# Patient Record
Sex: Male | Born: 1965 | Hispanic: No | Marital: Married | Smoking: Never smoker
Health system: Southern US, Community
[De-identification: ages and names within clinical notes are randomized; demographics above are authoritative.]

## PROBLEM LIST (undated history)

## (undated) DIAGNOSIS — E785 Hyperlipidemia, unspecified: Secondary | ICD-10-CM

## (undated) DIAGNOSIS — M1990 Unspecified osteoarthritis, unspecified site: Secondary | ICD-10-CM

## (undated) HISTORY — DX: Hyperlipidemia, unspecified: E78.5

## (undated) HISTORY — DX: Unspecified osteoarthritis, unspecified site: M19.90

---

## 1999-07-19 ENCOUNTER — Encounter: Payer: Self-pay | Admitting: Emergency Medicine

## 1999-07-19 ENCOUNTER — Emergency Department (HOSPITAL_COMMUNITY): Admission: EM | Admit: 1999-07-19 | Discharge: 1999-07-19 | Payer: Self-pay | Admitting: Emergency Medicine

## 1999-07-24 ENCOUNTER — Ambulatory Visit (HOSPITAL_BASED_OUTPATIENT_CLINIC_OR_DEPARTMENT_OTHER): Admission: RE | Admit: 1999-07-24 | Discharge: 1999-07-24 | Payer: Self-pay | Admitting: Orthopedic Surgery

## 2021-04-22 ENCOUNTER — Other Ambulatory Visit: Payer: Self-pay

## 2021-04-22 ENCOUNTER — Encounter: Payer: Self-pay | Admitting: Internal Medicine

## 2021-04-22 ENCOUNTER — Ambulatory Visit (INDEPENDENT_AMBULATORY_CARE_PROVIDER_SITE_OTHER): Payer: PRIVATE HEALTH INSURANCE | Admitting: Internal Medicine

## 2021-04-22 VITALS — BP 118/84 | HR 99 | Temp 98.2°F | Ht 61.75 in | Wt 201.0 lb

## 2021-04-22 DIAGNOSIS — Z23 Encounter for immunization: Secondary | ICD-10-CM

## 2021-04-22 DIAGNOSIS — M25561 Pain in right knee: Secondary | ICD-10-CM

## 2021-04-22 DIAGNOSIS — G4733 Obstructive sleep apnea (adult) (pediatric): Secondary | ICD-10-CM | POA: Diagnosis not present

## 2021-04-22 DIAGNOSIS — Z0000 Encounter for general adult medical examination without abnormal findings: Secondary | ICD-10-CM

## 2021-04-22 DIAGNOSIS — M25562 Pain in left knee: Secondary | ICD-10-CM

## 2021-04-22 DIAGNOSIS — E669 Obesity, unspecified: Secondary | ICD-10-CM

## 2021-04-22 DIAGNOSIS — R5383 Other fatigue: Secondary | ICD-10-CM

## 2021-04-22 DIAGNOSIS — Z1211 Encounter for screening for malignant neoplasm of colon: Secondary | ICD-10-CM | POA: Diagnosis not present

## 2021-04-22 NOTE — Patient Instructions (Signed)
-Nice seeing you today!!  -Return fasting for labs.  -tetanus and first shingles vaccines today.  -remember to get your COVID booster at your pharmacy.  -referral for colonoscopy has been made today.   Preventive Care 11-54 Years Old, Male Preventive care refers to lifestyle choices and visits with your health care provider that can promote health and wellness. This includes:  A yearly physical exam. This is also called an annual wellness visit.  Regular dental and eye exams.  Immunizations.  Screening for certain conditions.  Healthy lifestyle choices, such as: ? Eating a healthy diet. ? Getting regular exercise. ? Not using drugs or products that contain nicotine and tobacco. ? Limiting alcohol use. What can I expect for my preventive care visit? Physical exam Your health care provider will check your:  Height and weight. These may be used to calculate your BMI (body mass index). BMI is a measurement that tells if you are at a healthy weight.  Heart rate and blood pressure.  Body temperature.  Skin for abnormal spots. Counseling Your health care provider may ask you questions about your:  Past medical problems.  Family's medical history.  Alcohol, tobacco, and drug use.  Emotional well-being.  Home life and relationship well-being.  Sexual activity.  Diet, exercise, and sleep habits.  Work and work Astronomer.  Access to firearms. What immunizations do I need? Vaccines are usually given at various ages, according to a schedule. Your health care provider will recommend vaccines for you based on your age, medical history, and lifestyle or other factors, such as travel or where you work.   What tests do I need? Blood tests  Lipid and cholesterol levels. These may be checked every 5 years, or more often if you are over 85 years old.  Hepatitis C test.  Hepatitis B test. Screening  Lung cancer screening. You may have this screening every year  starting at age 35 if you have a 30-pack-year history of smoking and currently smoke or have quit within the past 15 years.  Prostate cancer screening. Recommendations will vary depending on your family history and other risks.  Genital exam to check for testicular cancer or hernias.  Colorectal cancer screening. ? All adults should have this screening starting at age 22 and continuing until age 40. ? Your health care provider may recommend screening at age 81 if you are at increased risk. ? You will have tests every 1-10 years, depending on your results and the type of screening test.  Diabetes screening. ? This is done by checking your blood sugar (glucose) after you have not eaten for a while (fasting). ? You may have this done every 1-3 years.  STD (sexually transmitted disease) testing, if you are at risk. Follow these instructions at home: Eating and drinking  Eat a diet that includes fresh fruits and vegetables, whole grains, lean protein, and low-fat dairy products.  Take vitamin and mineral supplements as recommended by your health care provider.  Do not drink alcohol if your health care provider tells you not to drink.  If you drink alcohol: ? Limit how much you have to 0-2 drinks a day. ? Be aware of how much alcohol is in your drink. In the [place] one drink equals one 12 oz bottle of beer (355 mL), one 5 oz glass of wine (148 mL), or one 1 oz glass of hard liquor (44 mL).   Lifestyle  Take daily care of your teeth and gums. Brush your teeth every  morning and night with fluoride toothpaste. Floss one time each day.  Stay active. Exercise for at least 30 minutes 5 or more days each week.  Do not use any products that contain nicotine or tobacco, such as cigarettes, e-cigarettes, and chewing tobacco. If you need help quitting, ask your health care provider.  Do not use drugs.  If you are sexually active, practice safe sex. Use a condom or other form of protection to  prevent STIs (sexually transmitted infections).  If told by your health care provider, take low-dose aspirin daily starting at age 67.  Find healthy ways to cope with stress, such as: ? Meditation, yoga, or listening to music. ? Journaling. ? Talking to a trusted person. ? Spending time with friends and family. Safety  Always wear your seat belt while driving or riding in a vehicle.  Do not drive: ? If you have been drinking alcohol. Do not ride with someone who has been drinking. ? When you are tired or distracted. ? While texting.  Wear a helmet and other protective equipment during sports activities.  If you have firearms in your house, make sure you follow all gun safety procedures. What's next?  Go to your health care provider once a year for an annual wellness visit.  Ask your health care provider how often you should have your eyes and teeth checked.  Stay up to date on all vaccines. This information is not intended to replace advice given to you by your health care provider. Make sure you discuss any questions you have with your health care provider. Document Revised: 08/15/2019 Document Reviewed: 11/10/2018 Elsevier Patient Education  2021 [employer].

## 2021-04-22 NOTE — Progress Notes (Signed)
New Patient Office Visit     This visit occurred during the SARS-CoV-2 public health emergency.  Safety protocols were in place, including screening questions prior to the visit, additional usage of staff PPE, and extensive cleaning of exam room while observing appropriate contact time as indicated for disinfecting solutions.    CC/Reason for Visit: Establish care, annual preventive exam, discuss some acute concerns Previous PCP: None Last Visit: Unknown  HPI: Anthony Nichols is a 55 y.o. male who is coming in today for the above mentioned reasons.  He has no past medical history of significance although he has not had routine medical care in over 25 years.  He works in Teacher, early years/pre.  He does not smoke, he does not drink, he has no known drug allergies.  He takes no medications, has had no past surgical history.  His family history significant for mother with diabetes as well as a brother with diabetes.  His father died from colon cancer at age 5 and his brother also passed with colon cancer in his 7s.  He has not yet had a colonoscopy.  He has had 2 COVID vaccines but no boosters, he has not had a recent Tdap or shingles vaccine.  He does not have routine eye or dental care.  He has been complaining of bilateral knee pain.  His wife has also been complaining of increased snoring, frequent nighttime awakenings.  He frequently has daytime fatigue.  He does not exercise routinely.  Past Medical/Surgical History: History reviewed. No pertinent past medical history.  History reviewed. No pertinent surgical history.  Social History:  reports that he has never smoked. He has never used smokeless tobacco. He reports current alcohol use of about 2.0 standard drinks of alcohol per week. He reports that he does not use drugs.  Allergies: No Known Allergies  Family History:  Family History  Problem Relation Age of Onset  . Diabetes Mother   . Colon cancer Father   . Colon cancer Brother      No current outpatient medications on file.  Review of Systems:  Constitutional: Denies fever, chills, diaphoresis, appetite change and fatigue.  HEENT: Denies photophobia, eye pain, redness, hearing loss, ear pain, congestion, sore throat, rhinorrhea, sneezing, mouth sores, trouble swallowing, neck pain, neck stiffness and tinnitus.   Respiratory: Denies SOB, DOE, cough, chest tightness,  and wheezing.   Cardiovascular: Denies chest pain, palpitations and leg swelling.  Gastrointestinal: Denies nausea, vomiting, abdominal pain, diarrhea, constipation, blood in stool and abdominal distention.  Genitourinary: Denies dysuria, urgency, frequency, hematuria, flank pain and difficulty urinating.  Endocrine: Denies: hot or cold intolerance, sweats, changes in hair or nails, polyuria, polydipsia. Musculoskeletal: Denies myalgias, back pain, joint swelling.  Skin: Denies pallor, rash and wound.  Neurological: Denies dizziness, seizures, syncope, weakness, light-headedness, numbness and headaches.  Hematological: Denies adenopathy. Easy bruising, personal or family bleeding history  Psychiatric/Behavioral: Denies suicidal ideation, mood changes, confusion, nervousness and agitation    Physical Exam: Vitals:   04/22/21 1559  BP: 118/84  Pulse: 99  Temp: 98.2 F (36.8 C)  TempSrc: Oral  SpO2: 96%  Weight: 201 lb (91.2 kg)  Height: 5' 1.75" (1.568 m)   Body mass index is 37.06 kg/m.   Constitutional: NAD, calm, comfortable Eyes: PERRL, lids and conjunctivae normal ENMT: Mucous membranes are moist. Posterior pharynx clear of any exudate or lesions. Normal dentition. Tympanic membrane is pearly white, no erythema or bulging. Neck: normal, supple, no masses, no thyromegaly Respiratory: clear  to auscultation bilaterally, no wheezing, no crackles. Normal respiratory effort. No accessory muscle use.  Cardiovascular: Regular rate and rhythm, no murmurs / rubs / gallops. No extremity edema.  2+ pedal pulses. No carotid bruits.  Abdomen: no tenderness, no masses palpated. No hepatosplenomegaly. Bowel sounds positive.  Musculoskeletal: no clubbing / cyanosis. No joint deformity upper and lower extremities. Good ROM, no contractures. Normal muscle tone.  Skin: no rashes, lesions, ulcers. No induration Neurologic: CN 2-12 grossly intact. Sensation intact, DTR normal. Strength 5/5 in all 4.  Psychiatric: Normal judgment and insight. Alert and oriented x 3. Normal mood.    Impression and Plan:  Encounter for preventive health examination  - Plan: CBC with Differential/Platelet, Comprehensive metabolic panel, Hemoglobin A1c, Lipid panel -Advised routine eye and dental care. -Tdap and for shingles in office today, he will get his COVID boosters at the pharmacy. -Screening labs today. -Healthy lifestyle discussed in detail. -Referral to GI for screening colonoscopy.  Acute pain of both knees -Suspect bilateral knee osteoarthritis  - Plan: Ambulatory referral to Sports Medicine  OSA (obstructive sleep apnea) -With snoring, obesity, frequent nighttime awakenings and daytime fatigue, I believe obstructive sleep apnea is likely.  - Plan: Ambulatory referral to Neurology for sleep medicine.  Colon cancer screening  - Plan: Ambulatory referral to Gastroenterology -Very significant family history with 2 first-degree family members dying at young age from colon cancer.  Need for Tdap vaccination  - Plan: Tdap vaccine greater than or equal to 7yo IM  Need for shingles vaccine  - Plan: Varicella-zoster vaccine IM (Shingrix)  Obesity (BMI 35.0-39.9 without comorbidity) -Discussed healthy lifestyle, including increased physical activity and better food choices to promote weight loss.  Fatigue, unspecified type  - Plan: TSH, Vitamin B12, VITAMIN D 25 Hydroxy (Vit-D Deficiency, Fractures)    Patient Instructions   -Nice seeing you today!!  -Return fasting for labs.  -tetanus  and first shingles vaccines today.  -remember to get your COVID booster at your pharmacy.  -referral for colonoscopy has been made today.   Preventive Care 98-49 Years Old, Male Preventive care refers to lifestyle choices and visits with your health care provider that can promote health and wellness. This includes:  A yearly physical exam. This is also called an annual wellness visit.  Regular dental and eye exams.  Immunizations.  Screening for certain conditions.  Healthy lifestyle choices, such as: ? Eating a healthy diet. ? Getting regular exercise. ? Not using drugs or products that contain nicotine and tobacco. ? Limiting alcohol use. What can I expect for my preventive care visit? Physical exam Your health care provider will check your:  Height and weight. These may be used to calculate your BMI (body mass index). BMI is a measurement that tells if you are at a healthy weight.  Heart rate and blood pressure.  Body temperature.  Skin for abnormal spots. Counseling Your health care provider may ask you questions about your:  Past medical problems.  Family's medical history.  Alcohol, tobacco, and drug use.  Emotional well-being.  Home life and relationship well-being.  Sexual activity.  Diet, exercise, and sleep habits.  Work and work Astronomer.  Access to firearms. What immunizations do I need? Vaccines are usually given at various ages, according to a schedule. Your health care provider will recommend vaccines for you based on your age, medical history, and lifestyle or other factors, such as travel or where you work.   What tests do I need? Blood tests  Lipid  and cholesterol levels. These may be checked every 5 years, or more often if you are over 70 years old.  Hepatitis C test.  Hepatitis B test. Screening  Lung cancer screening. You may have this screening every year starting at age 37 if you have a 30-pack-year history of smoking and  currently smoke or have quit within the past 15 years.  Prostate cancer screening. Recommendations will vary depending on your family history and other risks.  Genital exam to check for testicular cancer or hernias.  Colorectal cancer screening. ? All adults should have this screening starting at age 51 and continuing until age 7. ? Your health care provider may recommend screening at age 57 if you are at increased risk. ? You will have tests every 1-10 years, depending on your results and the type of screening test.  Diabetes screening. ? This is done by checking your blood sugar (glucose) after you have not eaten for a while (fasting). ? You may have this done every 1-3 years.  STD (sexually transmitted disease) testing, if you are at risk. Follow these instructions at home: Eating and drinking  Eat a diet that includes fresh fruits and vegetables, whole grains, lean protein, and low-fat dairy products.  Take vitamin and mineral supplements as recommended by your health care provider.  Do not drink alcohol if your health care provider tells you not to drink.  If you drink alcohol: ? Limit how much you have to 0-2 drinks a day. ? Be aware of how much alcohol is in your drink. In the [place] one drink equals one 12 oz bottle of beer (355 mL), one 5 oz glass of wine (148 mL), or one 1 oz glass of hard liquor (44 mL).   Lifestyle  Take daily care of your teeth and gums. Brush your teeth every morning and night with fluoride toothpaste. Floss one time each day.  Stay active. Exercise for at least 30 minutes 5 or more days each week.  Do not use any products that contain nicotine or tobacco, such as cigarettes, e-cigarettes, and chewing tobacco. If you need help quitting, ask your health care provider.  Do not use drugs.  If you are sexually active, practice safe sex. Use a condom or other form of protection to prevent STIs (sexually transmitted infections).  If told by your  health care provider, take low-dose aspirin daily starting at age 34.  Find healthy ways to cope with stress, such as: ? Meditation, yoga, or listening to music. ? Journaling. ? Talking to a trusted person. ? Spending time with friends and family. Safety  Always wear your seat belt while driving or riding in a vehicle.  Do not drive: ? If you have been drinking alcohol. Do not ride with someone who has been drinking. ? When you are tired or distracted. ? While texting.  Wear a helmet and other protective equipment during sports activities.  If you have firearms in your house, make sure you follow all gun safety procedures. What's next?  Go to your health care provider once a year for an annual wellness visit.  Ask your health care provider how often you should have your eyes and teeth checked.  Stay up to date on all vaccines. This information is not intended to replace advice given to you by your health care provider. Make sure you discuss any questions you have with your health care provider. Document Revised: 08/15/2019 Document Reviewed: 11/10/2018 Elsevier Patient Education  2021 [employer].  Lelon Frohlich, MD [facility]

## 2021-04-29 ENCOUNTER — Ambulatory Visit: Payer: Self-pay | Admitting: Nurse Practitioner

## 2021-04-30 ENCOUNTER — Other Ambulatory Visit: Payer: Self-pay | Admitting: Internal Medicine

## 2021-04-30 ENCOUNTER — Encounter: Payer: Self-pay | Admitting: Internal Medicine

## 2021-04-30 ENCOUNTER — Other Ambulatory Visit (INDEPENDENT_AMBULATORY_CARE_PROVIDER_SITE_OTHER): Payer: PRIVATE HEALTH INSURANCE

## 2021-04-30 ENCOUNTER — Telehealth: Payer: Self-pay | Admitting: Internal Medicine

## 2021-04-30 ENCOUNTER — Other Ambulatory Visit: Payer: Self-pay

## 2021-04-30 DIAGNOSIS — E781 Pure hyperglyceridemia: Secondary | ICD-10-CM

## 2021-04-30 DIAGNOSIS — E538 Deficiency of other specified B group vitamins: Secondary | ICD-10-CM

## 2021-04-30 DIAGNOSIS — E559 Vitamin D deficiency, unspecified: Secondary | ICD-10-CM

## 2021-04-30 DIAGNOSIS — L309 Dermatitis, unspecified: Secondary | ICD-10-CM

## 2021-04-30 DIAGNOSIS — R5383 Other fatigue: Secondary | ICD-10-CM

## 2021-04-30 DIAGNOSIS — Z0000 Encounter for general adult medical examination without abnormal findings: Secondary | ICD-10-CM

## 2021-04-30 LAB — CBC WITH DIFFERENTIAL/PLATELET
Basophils Absolute: 0.1 10*3/uL (ref 0.0–0.1)
Basophils Relative: 1 % (ref 0.0–3.0)
Eosinophils Absolute: 0.4 10*3/uL (ref 0.0–0.7)
Eosinophils Relative: 4.8 % (ref 0.0–5.0)
HCT: 46.3 % (ref 39.0–52.0)
Hemoglobin: 15.8 g/dL (ref 13.0–17.0)
Lymphocytes Relative: 23.1 % (ref 12.0–46.0)
Lymphs Abs: 1.7 10*3/uL (ref 0.7–4.0)
MCHC: 34.2 g/dL (ref 30.0–36.0)
MCV: 90.7 fl (ref 78.0–100.0)
Monocytes Absolute: 0.7 10*3/uL (ref 0.1–1.0)
Monocytes Relative: 8.9 % (ref 3.0–12.0)
Neutro Abs: 4.7 10*3/uL (ref 1.4–7.7)
Neutrophils Relative %: 62.2 % (ref 43.0–77.0)
Platelets: 208 10*3/uL (ref 150.0–400.0)
RBC: 5.1 Mil/uL (ref 4.22–5.81)
RDW: 14.2 % (ref 11.5–15.5)
WBC: 7.6 10*3/uL (ref 4.0–10.5)

## 2021-04-30 LAB — COMPREHENSIVE METABOLIC PANEL
ALT: 24 U/L (ref 0–53)
AST: 19 U/L (ref 0–37)
Albumin: 4.2 g/dL (ref 3.5–5.2)
Alkaline Phosphatase: 93 U/L (ref 39–117)
BUN: 19 mg/dL (ref 6–23)
CO2: 28 mEq/L (ref 19–32)
Calcium: 9.2 mg/dL (ref 8.4–10.5)
Chloride: 102 mEq/L (ref 96–112)
Creatinine, Ser: 0.98 mg/dL (ref 0.40–1.50)
GFR: 87.71 mL/min (ref >60.00)
Glucose, Bld: 95 mg/dL (ref 70–99)
Potassium: 4.4 mEq/L (ref 3.5–5.1)
Sodium: 141 mEq/L (ref 135–145)
Total Bilirubin: 1 mg/dL (ref 0.2–1.2)
Total Protein: 6.8 g/dL (ref 6.0–8.3)

## 2021-04-30 LAB — LIPID PANEL
Cholesterol: 174 mg/dL (ref 0–200)
HDL: 30.1 mg/dL — ABNORMAL LOW (ref >39.00)
NonHDL: 144.33
Total CHOL/HDL Ratio: 6
Triglycerides: 352 mg/dL — ABNORMAL HIGH (ref 0.0–149.0)
VLDL: 70.4 mg/dL — ABNORMAL HIGH (ref 0.0–40.0)

## 2021-04-30 LAB — VITAMIN D 25 HYDROXY (VIT D DEFICIENCY, FRACTURES): VITD: 13.57 ng/mL — ABNORMAL LOW (ref 30.00–100.00)

## 2021-04-30 LAB — VITAMIN B12: Vitamin B-12: 258 pg/mL (ref 211–911)

## 2021-04-30 LAB — HEMOGLOBIN A1C: Hgb A1c MFr Bld: 5.7 % (ref 4.6–6.5)

## 2021-04-30 LAB — TSH: TSH: 3.12 u[IU]/mL (ref 0.35–4.50)

## 2021-04-30 LAB — LDL CHOLESTEROL, DIRECT: Direct LDL: 87 mg/dL

## 2021-04-30 MED ORDER — VITAMIN D (ERGOCALCIFEROL) 1.25 MG (50000 UNIT) PO CAPS
50000.0000 [IU] | ORAL_CAPSULE | ORAL | 0 refills | Status: AC
Start: 2021-04-30 — End: 2021-07-17

## 2021-04-30 MED ORDER — FISH OIL 1000 MG PO CAPS: 1.0000 | ORAL_CAPSULE | Freq: Two times a day (BID) | ORAL | 0 refills | Status: AC

## 2021-04-30 MED ORDER — TRIAMCINOLONE ACETONIDE 0.1 % EX CREA: 1.0000 "application " | TOPICAL_CREAM | Freq: Two times a day (BID) | CUTANEOUS | 0 refills | Status: DC

## 2021-04-30 MED ORDER — VITAMIN B-12 1000 MCG PO TABS
1000.0000 ug | ORAL_TABLET | Freq: Every day | ORAL | Status: AC
Start: 2021-04-30 — End: ?

## 2021-04-30 NOTE — Telephone Encounter (Signed)
Pt came in for his lab appointment and inquired about a medication for a rash that he spoke with Dr, Ardyth Harps about on his visit on 04/23/2021.  Pt stated it is a cream.  Pharm:  [facility] on [address]

## 2021-05-06 ENCOUNTER — Ambulatory Visit: Payer: Self-pay

## 2021-05-06 ENCOUNTER — Ambulatory Visit (INDEPENDENT_AMBULATORY_CARE_PROVIDER_SITE_OTHER): Payer: PRIVATE HEALTH INSURANCE

## 2021-05-06 ENCOUNTER — Ambulatory Visit (INDEPENDENT_AMBULATORY_CARE_PROVIDER_SITE_OTHER): Payer: PRIVATE HEALTH INSURANCE | Admitting: Family Medicine

## 2021-05-06 ENCOUNTER — Encounter: Payer: Self-pay | Admitting: Gastroenterology

## 2021-05-06 ENCOUNTER — Other Ambulatory Visit: Payer: Self-pay

## 2021-05-06 ENCOUNTER — Encounter: Payer: Self-pay | Admitting: Family Medicine

## 2021-05-06 VITALS — BP 104/78 | HR 110 | Ht 61.75 in | Wt 208.6 lb

## 2021-05-06 DIAGNOSIS — M25561 Pain in right knee: Secondary | ICD-10-CM

## 2021-05-06 DIAGNOSIS — G8929 Other chronic pain: Secondary | ICD-10-CM

## 2021-05-06 DIAGNOSIS — M25562 Pain in left knee: Secondary | ICD-10-CM

## 2021-05-06 NOTE — Patient Instructions (Signed)
Thank you for coming in today.  Call or go to the ER if you develop a large red swollen joint with extreme pain or oozing puss.   Please get an Xray today before you leave  Please use Voltaren gel (Generic Diclofenac Gel) up to 4x daily for pain as needed.  This is available over-the-counter as both the name brand Voltaren gel and the generic diclofenac gel.  Recheck in about 6 weeks.  Return sooner if needed.

## 2021-05-06 NOTE — Progress Notes (Signed)
I, Anthony Nichols, LAT, ATC, am serving as scribe for Dr. [name].  Subjective:    I'm seeing this patient as a consultation for:  Dr. [name]. Note will be routed back to referring provider/PCP.  CC: Bilat knee pain  HPI: Pt is a 55 y/o male c/o bilat knee pain, R >L x couple years that has been worsening. Pt works in [employer]. Pt locates pain to his B ant knees and some into his medial knee as well.  Knee swelling: yes, intermittently Mechanical symptoms: yes Aggravates: prolonged standing; climbing a ladder Treatments tried: Tylenol  Past medical history, Surgical history, Family history, Social history, Allergies, and medications have been entered into the medical record, reviewed.   Review of Systems: No new headache, visual changes, nausea, vomiting, diarrhea, constipation, dizziness, abdominal pain, skin rash, fevers, chills, night sweats, weight loss, swollen lymph nodes, body aches, joint swelling, muscle aches, chest pain, shortness of breath, mood changes, visual or auditory hallucinations.   Objective:    Vitals:   05/06/21 1603  BP: 104/78  Pulse: (!) 110  SpO2: 95%   General: Well Developed, well nourished, and in no acute distress.  Neuro/Psych: Alert and oriented x3, extra-ocular muscles intact, able to move all 4 extremities, sensation grossly intact. Skin: Warm and dry, no rashes noted.  Respiratory: Not using accessory muscles, speaking in full sentences, trachea midline.  Cardiovascular: Pulses palpable, no extremity edema. Abdomen: Does not appear distended. MSK:  Right knee mild effusion normal-appearing otherwise normal motion with crepitation.   Tender palpation medial joint line. Stable ligamentous exam. Negative McMurray's test Intact strength.  Left knee slight swelling anterior knee overlying patella otherwise normal. Normal motion with crepitation. Tender palpation medial joint line. Stable ligamentous exam. Negative  McMurray's test. Intact strength.   Lab and Radiology Results  Procedure: Real-time Ultrasound Guided Injection of right knee superior lateral patellar space Device: Philips Affiniti 50G Images permanently stored and available for review in PACS Ultrasound evaluation prior to injection reveals mild effusion.  Significant medial compartment degenerative changes.  Calcifications present superior patellar space.  No Baker's cyst. Verbal informed consent obtained.  Discussed risks and benefits of procedure. Warned about infection bleeding damage to structures skin hypopigmentation and fat atrophy among others. Patient expresses understanding and agreement Time-out conducted.   Noted no overlying erythema, induration, or other signs of local infection.   Skin prepped in a sterile fashion.   Local anesthesia: Topical Ethyl chloride.   With sterile technique and under real time ultrasound guidance:  40 mg of Kenalog and 2 mL of Marcaine injected into knee joint. Fluid seen entering the joint capsule.   Completed without difficulty   Pain immediately resolved suggesting accurate placement of the medication.   Advised to call if fevers/chills, erythema, induration, drainage, or persistent bleeding.   Images permanently stored and available for review in the ultrasound unit.  Impression: Technically successful ultrasound guided injection.    Procedure: Real-time Ultrasound Guided Injection of left knee superior patellar space Device: Philips Affiniti 50G Images permanently stored and available for review in PACS Ultrasound evaluation prior to injection reveals moderate medial compartment DJD.  No Baker's cyst.  Calcifications present superior patellar space. Verbal informed consent obtained.  Discussed risks and benefits of procedure. Warned about infection bleeding damage to structures skin hypopigmentation and fat atrophy among others. Patient expresses understanding and agreement Time-out  conducted.   Noted no overlying erythema, induration, or other signs of local infection.   Skin prepped in  a sterile fashion.   Local anesthesia: Topical Ethyl chloride.   With sterile technique and under real time ultrasound guidance:  40 mg of Kenalog and 2 mL of Marcaine injected into knee joint. Fluid seen entering the joint capsule.   Completed without difficulty   Pain immediately resolved suggesting accurate placement of the medication.   Advised to call if fevers/chills, erythema, induration, drainage, or persistent bleeding.   Images permanently stored and available for review in the ultrasound unit.  Impression: Technically successful ultrasound guided injection.    X-ray images bilateral knees obtained today personally and independently interpreted  Right knee: Moderate medial compartment DJD.  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  Calcifications present superior lateral patellar space thought to represent a loose bodies.  Left knee: Mild to moderate bicompartmental DJD.  Mild to moderate patellofemoral DJD.  Calcifications present superior patellar space again thought to represent loose bodies.  Await formal radiology review    Impression and Recommendations:    Assessment and Plan: 55 y.o. male with bilateral knee pain.  Multifactorial cause of knee pain.  Predominantly main source of pain is DJD however patient does have significant loose bodies on x-ray which were also visible on ultrasound.  Plan to treat with Voltaren gel and steroid injections today.  Focus also on weight loss and quad strengthening.  Recheck in 6 weeks.Marland Kitchen  PDMP not reviewed this encounter. Orders Placed This Encounter  Procedures  . [place] LIMITED JOINT SPACE STRUCTURES LOW BILAT(NO LINKED CHARGES)    Order Specific Question:   Reason for Exam (SYMPTOM  OR DIAGNOSIS REQUIRED)    Answer:   B knee pain    Order Specific Question:   Preferred imaging location?    Answer:   [facility]  . DG Knee AP/LAT W/Sunrise Left    Standing Status:   Future    Number of Occurrences:   1    Standing Expiration Date:   06/05/2021    Order Specific Question:   Reason for Exam (SYMPTOM  OR DIAGNOSIS REQUIRED)    Answer:   L knee pain    Order Specific Question:   Preferred imaging location?    Answer:   [facility]  . DG Knee AP/LAT W/Sunrise Right    Standing Status:   Future    Number of Occurrences:   1    Standing Expiration Date:   06/05/2021    Order Specific Question:   Reason for Exam (SYMPTOM  OR DIAGNOSIS REQUIRED)    Answer:   R knee pain    Order Specific Question:   Preferred imaging location?    Answer:   [facility]   No orders of the defined types were placed in this encounter.   Discussed warning signs or symptoms. Please see discharge instructions. Patient expresses understanding.   The above documentation has been reviewed and is accurate and complete [name], M.D.

## 2021-05-07 ENCOUNTER — Encounter: Payer: Self-pay | Admitting: Family Medicine

## 2021-05-09 NOTE — Progress Notes (Signed)
X-ray right knee shows arthritis and loose bodies in the knee joint.

## 2021-05-09 NOTE — Progress Notes (Signed)
X-ray left knee shows arthritis with multiple loose bodies in the knee joint.  Plan on recheck in 6 weeks as scheduled.  If not better surgery may be an option

## 2021-05-12 NOTE — Progress Notes (Signed)
Spoke w/ pt and conveyed Dr. Corey's result note. Pt verbalized understanding.  

## 2021-06-09 ENCOUNTER — Telehealth: Payer: Self-pay | Admitting: *Deleted

## 2021-06-09 NOTE — Telephone Encounter (Signed)
No show for pre visit , unable to reach or leave a message.  Missed appointment letter sent, colonoscopy cancelled.

## 2021-06-17 ENCOUNTER — Ambulatory Visit: Payer: PRIVATE HEALTH INSURANCE | Admitting: Family Medicine

## 2021-06-23 ENCOUNTER — Encounter: Payer: PRIVATE HEALTH INSURANCE | Admitting: Gastroenterology

## 2021-06-27 NOTE — Progress Notes (Signed)
I, Anthony Nichols, LAT, ATC acting as a scribe for [name], MD.  Anthony Nichols is a 55 y.o. 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 Pt was last seen by Dr. Denyse Amass on 05/06/21 and was given bilat knee steroid injections and was advised to focus on weight loss, quad strengthening, and Voltaren gel. Today, pt reports that B knees continue. Had 2 weeks of relief from injection. L>R   Dx imaging: 05/06/21 R & L knee XR  Pertinent review of systems: No fevers or chills  Relevant historical information: Hypertriglyceridemia   Exam:  BP 118/82   Pulse (!) 113   Ht 5' 1.75" (1.568 m)   Wt 208 lb (94.3 kg)   SpO2 96%   BMI 38.35 kg/m  General: Well Developed, well nourished, and in no acute distress.   MSK: Knees bilateral moderate effusion normal motion with crepitation    Lab and Radiology Results [place] LIMITED JOINT SPACE STRUCTURES LOW BILAT(NO LINKED CHARGES)  Result Date: 05/08/2021 Formatting of this result is different from the original. Procedure: Real-time Ultrasound Guided Injection of right knee superior lateral patellar space Device: Philips Affiniti 50G Images permanently stored and available for review in PACS Ultrasound evaluation prior to injection reveals mild effusion.  Significant medial compartment degenerative changes.  Calcifications present superior patellar space.  No Baker's cyst. Verbal informed consent obtained.  Discussed risks and benefits of procedure. Warned about infection bleeding damage to structures skin hypopigmentation and fat atrophy among others. Patient expresses understanding and agreement Time-out conducted.   Noted no overlying erythema, induration, or other signs of local infection.   Skin prepped in a sterile fashion.   Local anesthesia: Topical Ethyl chloride.   With sterile technique and under real time ultrasound guidance:  40 mg of  Kenalog and 2 mL of Marcaine injected into knee joint. Fluid seen entering the joint capsule.   Completed without difficulty   Pain immediately resolved suggesting accurate placement of the medication.   Advised to call if fevers/chills, erythema, induration, drainage, or persistent bleeding.   Images permanently stored and available for review in the ultrasound unit. Impression: Technically successful ultrasound guided injection.       Procedure: Real-time Ultrasound Guided Injection of left knee superior patellar space Device: Philips Affiniti 50G Images permanently stored and available for review in PACS Ultrasound evaluation prior to injection reveals moderate medial compartment DJD.  No Baker's cyst.  Calcifications present superior patellar space. Verbal informed consent obtained.  Discussed risks and benefits of procedure. Warned about infection bleeding damage to structures skin hypopigmentation and fat atrophy among others. Patient expresses understanding and agreement Time-out conducted.   Noted no overlying erythema, induration, or other signs of local infection.   Skin prepped in a sterile fashion.   Local anesthesia: Topical Ethyl chloride.   With sterile technique and under real time ultrasound guidance:  40 mg of Kenalog and 2 mL of Marcaine injected into knee joint. Fluid seen entering the joint capsule.   Completed without difficulty   Pain immediately resolved suggesting accurate placement of the medication.   Advised to call if fevers/chills, erythema, induration, drainage, or persistent bleeding.   Images permanently stored and available for review in the ultrasound unit. Impression: Technically successful ultrasound guided injection.    DG Knee AP/LAT W/Sunrise Left  Result Date: 05/08/2021 CLINICAL DATA:  Anterior knee pain EXAM: LEFT  KNEE 3 VIEWS COMPARISON:  None. FINDINGS: No fracture or malalignment. Tricompartment arthritis with severe disease involving the patellofemoral joint space.  Multiple calcified suprapatellar loose bodies. 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   On: 05/08/2021 18:14   DG Knee AP/LAT W/Sunrise Right  Result Date: 05/08/2021 CLINICAL DATA:  Right-sided knee pain EXAM: RIGHT KNEE 3 VIEWS COMPARISON:  None. FINDINGS: No fracture or malalignment. Tricompartment arthritis with moderate severe degenerative changes involving the medial and patellofemoral joint spaces. Multiple calcified loose bodies in the suprapatellar region. Joint space calcification consistent with chondrocalcinosis IMPRESSION: 1. Tricompartment arthritis with probable calcified loose bodies 2. Chondrocalcinosis Electronically Signed   By: Jasmine Pang M.D.   On: 05/08/2021 18:15    I, [name], personally (independently) visualized and performed the interpretation of the xray images attached in this note.     Assessment and Plan: 55 y.o. male with bilateral knee pain due to DJD and multiple calcified loose bodies.  Patient has pretty significant patellofemoral DJD bilaterally which probably is the majority source of pain.  The source of his multiple loose bodies is unclear at this time.  Steroid injection did not provide any sort of lasting relief.  Plan to work on authorization for hyaluronic acid injections and proceed with gel shots.  If this does not help more gel shots or not available would recommend orthopedic surgery consultation for potential total knee replacement evaluation.    Discussed warning signs or symptoms. Please see discharge instructions. Patient expresses understanding.   The above documentation has been reviewed and is accurate and complete [name], M.D.

## 2021-06-30 ENCOUNTER — Other Ambulatory Visit: Payer: Self-pay

## 2021-06-30 ENCOUNTER — Encounter: Payer: Self-pay | Admitting: Family Medicine

## 2021-06-30 ENCOUNTER — Ambulatory Visit (INDEPENDENT_AMBULATORY_CARE_PROVIDER_SITE_OTHER): Payer: PRIVATE HEALTH INSURANCE | Admitting: Family Medicine

## 2021-06-30 VITALS — BP 118/82 | HR 113 | Ht 61.75 in | Wt 208.0 lb

## 2021-06-30 DIAGNOSIS — G8929 Other chronic pain: Secondary | ICD-10-CM | POA: Diagnosis not present

## 2021-06-30 DIAGNOSIS — M25561 Pain in right knee: Secondary | ICD-10-CM | POA: Diagnosis not present

## 2021-06-30 DIAGNOSIS — M25562 Pain in left knee: Secondary | ICD-10-CM | POA: Diagnosis not present

## 2021-06-30 NOTE — Patient Instructions (Signed)
Thank you for coming in today.   I will work on the gel shots. Let me know if you do not hear anything in 2 weeks.   If the gel shots dont work or we cant get them then it may make sense to talk to a surgeon about knee replacement.

## 2021-07-21 ENCOUNTER — Encounter: Payer: Self-pay | Admitting: Family Medicine

## 2021-10-06 ENCOUNTER — Ambulatory Visit: Payer: PRIVATE HEALTH INSURANCE | Admitting: Family Medicine

## 2021-10-10 ENCOUNTER — Ambulatory Visit (INDEPENDENT_AMBULATORY_CARE_PROVIDER_SITE_OTHER): Payer: PRIVATE HEALTH INSURANCE | Admitting: Family Medicine

## 2021-10-10 ENCOUNTER — Encounter: Payer: Self-pay | Admitting: Family Medicine

## 2021-10-10 ENCOUNTER — Other Ambulatory Visit: Payer: Self-pay

## 2021-10-10 ENCOUNTER — Ambulatory Visit: Payer: Self-pay

## 2021-10-10 DIAGNOSIS — M170 Bilateral primary osteoarthritis of knee: Secondary | ICD-10-CM | POA: Diagnosis not present

## 2021-10-10 DIAGNOSIS — G8929 Other chronic pain: Secondary | ICD-10-CM

## 2021-10-10 DIAGNOSIS — M25561 Pain in right knee: Secondary | ICD-10-CM

## 2021-10-10 DIAGNOSIS — M25562 Pain in left knee: Secondary | ICD-10-CM

## 2021-10-10 NOTE — Progress Notes (Signed)
Anthony Nichols presents to clinic today for bilateral Orthovisc injections 1/3  Procedure: Real-time Ultrasound Guided Injection of right knee superior lateral patellar space Device: Philips Affiniti 50G Images permanently stored and available for review in PACS Verbal informed consent obtained.  Discussed risks and benefits of procedure. Warned about infection bleeding damage to structures skin hypopigmentation and fat atrophy among others. Patient expresses understanding and agreement Time-out conducted.   Noted no overlying erythema, induration, or other signs of local infection.   Skin prepped in a sterile fashion.   Local anesthesia: Topical Ethyl chloride.   With sterile technique and under real time ultrasound guidance: Orthovisc 30 mg injected into knee joint. Fluid seen entering the joint capsule.   Completed without difficulty   Advised to call if fevers/chills, erythema, induration, drainage, or persistent bleeding.   Images permanently stored and available for review in the ultrasound unit.  Impression: Technically successful ultrasound guided injection.    Procedure: Real-time Ultrasound Guided Injection of left knee superior lateral patellar space Device: Philips Affiniti 50G Images permanently stored and available for review in PACS Verbal informed consent obtained.  Discussed risks and benefits of procedure. Warned about infection bleeding damage to structures skin hypopigmentation and fat atrophy among others. Patient expresses understanding and agreement Time-out conducted.   Noted no overlying erythema, induration, or other signs of local infection.   Skin prepped in a sterile fashion.   Local anesthesia: Topical Ethyl chloride.   With sterile technique and under real time ultrasound guidance: Orthovisc 30 mg injected into knee joint. Fluid seen entering the joint capsule.   Completed without difficulty   Advised to call if fevers/chills, erythema, induration, drainage, or  persistent bleeding.   Images permanently stored and available for review in the ultrasound unit.  Impression: Technically successful ultrasound guided injection.  Lot number: [id number] for both injections  Return in 1 week Orthovisc injection bilateral knees 2/3

## 2021-10-10 NOTE — Patient Instructions (Signed)
Good to see you today.  You had B knee Orthovisc injections.  Call or go to the ER if you develop a large red swollen joint with extreme pain or oozing puss.   Please schedule 2 follow-up visits over the next 2 weeks (1 each week for the next 2 weeks).

## 2021-10-17 ENCOUNTER — Ambulatory Visit: Payer: PRIVATE HEALTH INSURANCE | Admitting: Family Medicine

## 2021-10-27 ENCOUNTER — Other Ambulatory Visit: Payer: Self-pay

## 2021-10-27 ENCOUNTER — Ambulatory Visit (INDEPENDENT_AMBULATORY_CARE_PROVIDER_SITE_OTHER): Payer: PRIVATE HEALTH INSURANCE | Admitting: Family Medicine

## 2021-10-27 ENCOUNTER — Ambulatory Visit: Payer: Self-pay

## 2021-10-27 DIAGNOSIS — M25562 Pain in left knee: Secondary | ICD-10-CM

## 2021-10-27 DIAGNOSIS — G8929 Other chronic pain: Secondary | ICD-10-CM

## 2021-10-27 DIAGNOSIS — M25561 Pain in right knee: Secondary | ICD-10-CM | POA: Diagnosis not present

## 2021-10-27 DIAGNOSIS — M170 Bilateral primary osteoarthritis of knee: Secondary | ICD-10-CM | POA: Diagnosis not present

## 2021-10-27 NOTE — Progress Notes (Signed)
Anthony Nichols presents to clinic today for Orthovisc injections bilateral knees 2/3  Procedure: Real-time Ultrasound Guided Injection of right knee superior lateral patellar space Device: Philips Affiniti 50G Images permanently stored and available for review in PACS Verbal informed consent obtained.  Discussed risks and benefits of procedure. Warned about infection bleeding damage to structures skin hypopigmentation and fat atrophy among others. Patient expresses understanding and agreement Time-out conducted.   Noted no overlying erythema, induration, or other signs of local infection.   Skin prepped in a sterile fashion.   Local anesthesia: Topical Ethyl chloride.   With sterile technique and under real time ultrasound guidance: Orthovisc 30 mg injected into knee joint. Fluid seen entering the joint capsule.   Completed without difficulty    Advised to call if fevers/chills, erythema, induration, drainage, or persistent bleeding.   Images permanently stored and available for review in the ultrasound unit.  Impression: Technically successful ultrasound guided injection.    Procedure: Real-time Ultrasound Guided Injection of left knee superior lateral patellar space Device: Philips Affiniti 50G Images permanently stored and available for review in PACS Verbal informed consent obtained.  Discussed risks and benefits of procedure. Warned about infection bleeding damage to structures skin hypopigmentation and fat atrophy among others. Patient expresses understanding and agreement Time-out conducted.   Noted no overlying erythema, induration, or other signs of local infection.   Skin prepped in a sterile fashion.   Local anesthesia: Topical Ethyl chloride.   With sterile technique and under real time ultrasound guidance: Orthovisc 30 mg injected into knee joint. Fluid seen entering the joint capsule.   Completed without difficulty   Advised to call if fevers/chills, erythema, induration, drainage,  or persistent bleeding.   Images permanently stored and available for review in the ultrasound unit.  Impression: Technically successful ultrasound guided injection.  Lot number: [id number]  Return in 1 week for Orthovisc injection bilateral knees 3/3

## 2021-10-27 NOTE — Patient Instructions (Addendum)
Thank you for coming in today.   You received gel injections in both of your knees today. Seek immediate medical attention if the joint becomes red, extremely painful, or is oozing fluid.   Schedule a visit with me for next week for the 3rd Orthovisc injection.

## 2021-11-03 ENCOUNTER — Ambulatory Visit: Payer: PRIVATE HEALTH INSURANCE | Admitting: Family Medicine

## 2022-02-09 ENCOUNTER — Ambulatory Visit (INDEPENDENT_AMBULATORY_CARE_PROVIDER_SITE_OTHER): Payer: Managed Care, Other (non HMO) | Admitting: Family Medicine

## 2022-02-09 ENCOUNTER — Other Ambulatory Visit: Payer: Self-pay

## 2022-02-09 VITALS — BP 156/102 | HR 99 | Ht 61.75 in | Wt 206.0 lb

## 2022-02-09 DIAGNOSIS — R202 Paresthesia of skin: Secondary | ICD-10-CM | POA: Diagnosis not present

## 2022-02-09 DIAGNOSIS — M25562 Pain in left knee: Secondary | ICD-10-CM

## 2022-02-09 DIAGNOSIS — M25561 Pain in right knee: Secondary | ICD-10-CM | POA: Diagnosis not present

## 2022-02-09 DIAGNOSIS — G8929 Other chronic pain: Secondary | ICD-10-CM | POA: Diagnosis not present

## 2022-02-09 NOTE — Progress Notes (Signed)
? ?  I, Philbert Riser, LAT, ATC acting as a scribe for [name], MD. ? ?Anthony Nichols is a 56 y.o. male who presents to [facility] at [facility] today for bilat knee pain. Pt was last seen by Dr. Denyse Amass on 10/27/21 for chronic bilat knee pain and was given his 2nd Orthovisc injection bilaterally. Pt never returned to complete the Orthovisc series, no-show 11/03/21. Today, pt reports he wanted to get the 3rd Orthovisc injection today. [place] and [place] visit.  Since he was seen in November and completed his second Orthovisc injection his insurance has changed and the authorization for Orthovisc is lapsed. ? ?Additionally he notes that his knee is feeling a fair amount better. ? ?He does note today that he is having bothersome paresthesias to his left foot noting that he has numbness tingling and a feeling that his foot is asleep when he walks.  He complains that this is the entire aspect of his plantar foot.  This has been ongoing for months and is very bothersome. ? ?Dx imaging: 05/06/21 R & L knee XR ? ?Pertinent review of systems: No fevers or chills ? ?Relevant historical information: B12 deficiency and hypertriglyceridemia history. ? ? ?Exam:  ?BP (!) 156/102   Pulse 99   Ht 5' 1.75" (1.568 m)   Wt 206 lb (93.4 kg)   SpO2 97%   BMI 37.98 kg/m?  ?General: Well Developed, well nourished, and in no acute distress.  ? ?MSK: Bilateral knees normal.  Normal motion with crepitation. ? ?Left foot normal-appearing ?Normal motion.  Sensation is intact to light touch ?Pulses are intact. ? ? ? ? ? ? ?Assessment and Plan: ?56 y.o. male with bilateral knee pain due to DJD.  He never did complete his Orthovisc series back in November.  We are unable to complete the Orthovisc series today as his insurance is changed and the authorization has lapsed.  However I do not think that we need to is he is feeling a lot better.  He will provide [place] with his new insurance  information so that we can work on the new authorization if necessary. ? ?However his new issue today is bothersome paresthesias left foot.  The distribution is not consistent with any isolated dermatomal pattern and I am concerned that he may have peripheral neuropathy.  Plan for nerve conduction study to help clarify his paresthesia. ? ?Recheck after nerve conduction study. ? ? ?PDMP not reviewed this encounter. ?Orders Placed This Encounter  ?Procedures  ? Ambulatory referral to Neurology  ?  Referral Priority:   Routine  ?  Referral Type:   Consultation  ?  Referral Reason:   Specialty Services Required  ?  Requested Specialty:   Neurology  ?  Number of Visits Requested:   1  ? NCV with EMG(electromyography)  ?  Standing Status:   Future  ?  Standing Expiration Date:   02/10/2023  ?  Order Specific Question:   Where should this test be performed?  ?  Answer:   [facility]  ? ?No orders of the defined types were placed in this encounter. ? ? ? ?Discussed warning signs or symptoms. Please see discharge instructions. Patient expresses understanding. ? ? ?The above documentation has been reviewed and is accurate and complete [name], M.D. ? ? ?

## 2022-02-09 NOTE — Patient Instructions (Addendum)
Thank you for coming in today.  ? ?Please send [place] a MyChart message with your new insurance card ? ?I've ordered a nerve conduction study. ? ?Recheck back after test ?

## 2022-02-23 ENCOUNTER — Telehealth: Payer: Self-pay | Admitting: Internal Medicine

## 2022-02-23 ENCOUNTER — Ambulatory Visit: Payer: Managed Care, Other (non HMO) | Admitting: Internal Medicine

## 2022-02-23 NOTE — Telephone Encounter (Signed)
Ok for pt to transfer? ?

## 2022-02-23 NOTE — Telephone Encounter (Signed)
Fine with me. BJ 

## 2022-02-23 NOTE — Telephone Encounter (Signed)
Patient is requesting to transfer to Dr. [place]. ?

## 2022-02-24 NOTE — Telephone Encounter (Signed)
Left message to return phone call.  Please schedule pt for transfer of care.  ?

## 2022-03-23 ENCOUNTER — Ambulatory Visit (INDEPENDENT_AMBULATORY_CARE_PROVIDER_SITE_OTHER): Payer: Managed Care, Other (non HMO) | Admitting: Internal Medicine

## 2022-03-23 VITALS — BP 114/70 | HR 88 | Temp 98.5°F | Ht 63.0 in | Wt 205.5 lb

## 2022-03-23 DIAGNOSIS — Z23 Encounter for immunization: Secondary | ICD-10-CM

## 2022-03-23 DIAGNOSIS — E538 Deficiency of other specified B group vitamins: Secondary | ICD-10-CM | POA: Diagnosis not present

## 2022-03-23 DIAGNOSIS — Z0000 Encounter for general adult medical examination without abnormal findings: Secondary | ICD-10-CM | POA: Diagnosis not present

## 2022-03-23 DIAGNOSIS — E781 Pure hyperglyceridemia: Secondary | ICD-10-CM

## 2022-03-23 DIAGNOSIS — H539 Unspecified visual disturbance: Secondary | ICD-10-CM

## 2022-03-23 DIAGNOSIS — E559 Vitamin D deficiency, unspecified: Secondary | ICD-10-CM

## 2022-03-23 DIAGNOSIS — Z125 Encounter for screening for malignant neoplasm of prostate: Secondary | ICD-10-CM | POA: Diagnosis not present

## 2022-03-23 DIAGNOSIS — Z1211 Encounter for screening for malignant neoplasm of colon: Secondary | ICD-10-CM

## 2022-03-23 DIAGNOSIS — Z114 Encounter for screening for human immunodeficiency virus [HIV]: Secondary | ICD-10-CM

## 2022-03-23 DIAGNOSIS — Z1159 Encounter for screening for other viral diseases: Secondary | ICD-10-CM

## 2022-03-23 DIAGNOSIS — J302 Other seasonal allergic rhinitis: Secondary | ICD-10-CM

## 2022-03-23 LAB — CBC WITH DIFFERENTIAL/PLATELET
Basophils Absolute: 0.1 10*3/uL (ref 0.0–0.1)
Basophils Relative: 1 % (ref 0.0–3.0)
Eosinophils Absolute: 0.4 10*3/uL (ref 0.0–0.7)
Eosinophils Relative: 6.6 % — ABNORMAL HIGH (ref 0.0–5.0)
HCT: 47.8 % (ref 39.0–52.0)
Hemoglobin: 15.9 g/dL (ref 13.0–17.0)
Lymphocytes Relative: 41.1 % (ref 12.0–46.0)
Lymphs Abs: 2.3 10*3/uL (ref 0.7–4.0)
MCHC: 33.3 g/dL (ref 30.0–36.0)
MCV: 92.8 fl (ref 78.0–100.0)
Monocytes Absolute: 0.3 10*3/uL (ref 0.1–1.0)
Monocytes Relative: 5.9 % (ref 3.0–12.0)
Neutro Abs: 2.6 10*3/uL (ref 1.4–7.7)
Neutrophils Relative %: 45.4 % (ref 43.0–77.0)
Platelets: 193 10*3/uL (ref 150.0–400.0)
RBC: 5.15 Mil/uL (ref 4.22–5.81)
RDW: 13.9 % (ref 11.5–15.5)
WBC: 5.7 10*3/uL (ref 4.0–10.5)

## 2022-03-23 LAB — COMPREHENSIVE METABOLIC PANEL
ALT: 28 U/L (ref 0–53)
AST: 22 U/L (ref 0–37)
Albumin: 4 g/dL (ref 3.5–5.2)
Alkaline Phosphatase: 96 U/L (ref 39–117)
BUN: 16 mg/dL (ref 6–23)
CO2: 25 mEq/L (ref 19–32)
Calcium: 8.6 mg/dL (ref 8.4–10.5)
Chloride: 106 mEq/L (ref 96–112)
Creatinine, Ser: 0.82 mg/dL (ref 0.40–1.50)
GFR: 99.29 mL/min (ref >60.00)
Glucose, Bld: 117 mg/dL — ABNORMAL HIGH (ref 70–99)
Potassium: 3.9 mEq/L (ref 3.5–5.1)
Sodium: 141 mEq/L (ref 135–145)
Total Bilirubin: 0.5 mg/dL (ref 0.2–1.2)
Total Protein: 6.7 g/dL (ref 6.0–8.3)

## 2022-03-23 LAB — LIPID PANEL
Cholesterol: 188 mg/dL (ref 0–200)
HDL: 33 mg/dL — ABNORMAL LOW (ref >39.00)
NonHDL: 155.3
Total CHOL/HDL Ratio: 6
Triglycerides: 322 mg/dL — ABNORMAL HIGH (ref 0.0–149.0)
VLDL: 64.4 mg/dL — ABNORMAL HIGH (ref 0.0–40.0)

## 2022-03-23 LAB — LDL CHOLESTEROL, DIRECT: Direct LDL: 101 mg/dL

## 2022-03-23 LAB — HEMOGLOBIN A1C: Hgb A1c MFr Bld: 5.8 % (ref 4.6–6.5)

## 2022-03-23 LAB — VITAMIN B12: Vitamin B-12: 283 pg/mL (ref 211–911)

## 2022-03-23 LAB — VITAMIN D 25 HYDROXY (VIT D DEFICIENCY, FRACTURES): VITD: 18.35 ng/mL — ABNORMAL LOW (ref 30.00–100.00)

## 2022-03-23 LAB — PSA: PSA: 0.49 ng/mL (ref 0.10–4.00)

## 2022-03-23 NOTE — Progress Notes (Signed)
? ? ? ?Established Patient Office Visit ? ? ? ? ?This visit occurred during the SARS-CoV-2 public health emergency.  Safety protocols were in place, including screening questions prior to the visit, additional usage of staff PPE, and extensive cleaning of exam room while observing appropriate contact time as indicated for disinfecting solutions.  ? ? ?CC/Reason for Visit: Annual preventive exam ? ?HPI: Anthony Nichols is a 56 y.o. male who is coming in today for the above mentioned reasons. Past Medical History is significant for: Vitamin D and B12 deficiencies.  He is overdue for eye and dental care.  He is overdue for his second shingles vaccine, COVID and influenza vaccinations.  He has not yet had his initial screening colonoscopy.  He has family history that is very significant for colon cancer including his father who had colon cancer at age 107 and his brother who had some form of intestinal cancer in his 19s.  He is feeling well and has no acute concerns. ? ? ?Past Medical/Surgical History: ?No past medical history on file. ? ?No past surgical history on file. ? ?Social History: ? reports that he has never smoked. He has never used smokeless tobacco. He reports current alcohol use of about 2.0 standard drinks per week. He reports that he does not use drugs. ? ?Allergies: ?No Known Allergies ? ?Family History:  ?Family History  ?Problem Relation Age of Onset  ? Diabetes Mother   ? Colon cancer Father   ? Colon cancer Brother   ? ? ? ?Current Outpatient Medications:  ?  Omega-3 Fatty Acids (FISH OIL) 1000 MG CAPS, Take 1 capsule (1,000 mg total) by mouth in the morning and at bedtime., Disp: , Rfl: 0 ?  triamcinolone cream (KENALOG) 0.1 %, Apply 1 application topically 2 (two) times daily., Disp: 30 g, Rfl: 0 ?  vitamin B-12 (CYANOCOBALAMIN) 1000 MCG tablet, Take 1 tablet (1,000 mcg total) by mouth daily., Disp: , Rfl:  ? ?Review of Systems:  ?Constitutional: Denies fever, chills, diaphoresis, appetite  change and fatigue.  ?HEENT: Denies photophobia, eye pain, redness, hearing loss, ear pain, congestion, sore throat, rhinorrhea, sneezing, mouth sores, trouble swallowing, neck pain, neck stiffness and tinnitus.   ?Respiratory: Denies SOB, DOE, cough, chest tightness,  and wheezing.   ?Cardiovascular: Denies chest pain, palpitations and leg swelling.  ?Gastrointestinal: Denies nausea, vomiting, abdominal pain, diarrhea, constipation, blood in stool and abdominal distention.  ?Genitourinary: Denies dysuria, urgency, frequency, hematuria, flank pain and difficulty urinating.  ?Endocrine: Denies: hot or cold intolerance, sweats, changes in hair or nails, polyuria, polydipsia. ?Musculoskeletal: Denies myalgias, back pain, joint swelling, arthralgias and gait problem.  ?Skin: Denies pallor, rash and wound.  ?Neurological: Denies dizziness, seizures, syncope, weakness, light-headedness, numbness and headaches.  ?Hematological: Denies adenopathy. Easy bruising, personal or family bleeding history  ?Psychiatric/Behavioral: Denies suicidal ideation, mood changes, confusion, nervousness, sleep disturbance and agitation ? ? ? ?Physical Exam: ?Vitals:  ? 03/23/22 0903  ?BP: 114/70  ?Pulse: 88  ?Temp: 98.5 ?F (36.9 ?C)  ?TempSrc: Oral  ?SpO2: 94%  ?Weight: 205 lb 8 oz (93.2 kg)  ?Height: 5\' 3"  (1.6 m)  ? ? ?Body mass index is 36.4 kg/m?. ? ? ?Constitutional: NAD, calm, comfortable ?Eyes: PERRL, lids and conjunctivae normal ?ENMT: Mucous membranes are moist. Posterior pharynx clear of any exudate or lesions. Normal dentition. Tympanic membrane is pearly white, no erythema or bulging. ?Neck: normal, supple, no masses, no thyromegaly ?Respiratory: clear to auscultation bilaterally, no wheezing, no crackles. Normal respiratory  effort. No accessory muscle use.  ?Cardiovascular: Regular rate and rhythm, no murmurs / rubs / gallops. No extremity edema. 2+ pedal pulses. No carotid bruits.  ?Abdomen: no tenderness, no masses palpated. No  hepatosplenomegaly. Bowel sounds positive.  ?Musculoskeletal: no clubbing / cyanosis. No joint deformity upper and lower extremities. Good ROM, no contractures. Normal muscle tone.  ?Skin: no rashes, lesions, ulcers. No induration ?Neurologic: CN 2-12 grossly intact. Sensation intact, DTR normal. Strength 5/5 in all 4.  ?Psychiatric: Normal judgment and insight. Alert and oriented x 3. Normal mood.  ? ? ?Impression and Plan: ? ?Encounter for preventive health examination  ?-Recommend routine eye and dental care. ?-Immunizations: Second shingles today, advised flu and COVID at pharmacy but he declines ?-Healthy lifestyle discussed in detail. ?-Labs to be updated today. ?-Colon cancer screening: Overdue, high risk due to family history, recent GI referral today. ?-Breast cancer screening: Not applicable ?-Cervical cancer screening: Not applicable ?-Lung cancer screening: Not applicable ?-Prostate cancer screening: PSA today ?-DEXA: Not applicable ? ?Vitamin D deficiency  ?- Plan: VITAMIN D 25 Hydroxy (Vit-D Deficiency, Fractures) ? ?Vitamin B12 deficiency  ?- Plan: Vitamin B12 ? ?Hypertriglyceridemia  ?- Plan: CBC with Differential/Platelet, Comprehensive metabolic panel, Lipid panel, Hemoglobin A1c ? ?Need for shingles vaccine ?-Second shingles today ? ?Encounter for screening for HIV ?-HIV antibody ? ?Encounter for hepatitis C screening test for low risk patient ?-Hep C antibody ? ?Colon cancer screening ?-This is a high risk patient due to family history, he did not follow through with GI referral previously, will resend today. ? ?Seasonal allergies ?-Allergy referral per request, have advised daily antihistamine. ? ? ? ? ?Patient Instructions  ?-Nice seeing you today!! ? ?-Lab work today; will notify you once results are available. ? ?-Second shingles vaccine today. ? ?-Remember your COVID vaccines. ? ?-eye, allergy and GI referrals placed today. ? ?-Schedule follow up in 1 year or sooner as  needed. ? ? ? ? ? ?Lelon Frohlich, MD ?[facility] ? ? ?

## 2022-03-23 NOTE — Addendum Note (Signed)
Addended byEncarnacion Slates on: 03/23/2022 09:36 AM ? ? Modules accepted: Orders ? ?

## 2022-03-23 NOTE — Patient Instructions (Signed)
-  Nice seeing you today!! ? ?-Lab work today; will notify you once results are available. ? ?-Second shingles vaccine today. ? ?-Remember your COVID vaccines. ? ?-eye, allergy and GI referrals placed today. ? ?-Schedule follow up in 1 year or sooner as needed. ? ? ?

## 2022-03-24 ENCOUNTER — Other Ambulatory Visit: Payer: Self-pay | Admitting: Internal Medicine

## 2022-03-24 DIAGNOSIS — E559 Vitamin D deficiency, unspecified: Secondary | ICD-10-CM

## 2022-03-24 LAB — HIV ANTIBODY (ROUTINE TESTING W REFLEX): HIV 1&2 Ab, 4th Generation: NONREACTIVE

## 2022-03-24 LAB — HEPATITIS C ANTIBODY
Hepatitis C Ab: NONREACTIVE
SIGNAL TO CUT-OFF: 0.1 (ref <1.00)

## 2022-03-24 MED ORDER — VITAMIN D (ERGOCALCIFEROL) 1.25 MG (50000 UNIT) PO CAPS: 50000.0000 [IU] | ORAL_CAPSULE | ORAL | 0 refills | Status: AC

## 2022-03-31 ENCOUNTER — Encounter: Payer: Self-pay | Admitting: Internal Medicine

## 2022-03-31 ENCOUNTER — Other Ambulatory Visit: Payer: Self-pay | Admitting: *Deleted

## 2022-03-31 DIAGNOSIS — E781 Pure hyperglyceridemia: Secondary | ICD-10-CM

## 2022-03-31 DIAGNOSIS — E559 Vitamin D deficiency, unspecified: Secondary | ICD-10-CM

## 2022-05-11 ENCOUNTER — Telehealth: Payer: Self-pay | Admitting: Internal Medicine

## 2022-05-11 NOTE — Telephone Encounter (Signed)
Patient is requesting to transfer to Dr. [place].    (Phone info for front staff--[place]- [phone])

## 2022-05-11 NOTE — Telephone Encounter (Signed)
This was okayed back in March.

## 2022-06-01 NOTE — Progress Notes (Unsigned)
ACUTE VISIT No chief complaint on file.  HPI: [name] is a 56 y.o. male, who is here today complaining of *** HPI  Review of Systems Rest see pertinent positives and negatives per HPI.  Current Outpatient Medications on File Prior to Visit  Medication Sig Dispense Refill  . Omega-3 Fatty Acids (FISH OIL) 1000 MG CAPS Take 1 capsule (1,000 mg total) by mouth in the morning and at bedtime.  0  . triamcinolone cream (KENALOG) 0.1 % Apply 1 application topically 2 (two) times daily. 30 g 0  . vitamin B-12 (CYANOCOBALAMIN) 1000 MCG tablet Take 1 tablet (1,000 mcg total) by mouth daily.    . Vitamin D, Ergocalciferol, (DRISDOL) 1.25 MG (50000 UNIT) CAPS capsule Take 1 capsule (50,000 Units total) by mouth every 7 (seven) days for 12 doses. 12 capsule 0   No current facility-administered medications on file prior to visit.     No past medical history on file. No Known Allergies  Social History   Socioeconomic History  . Marital status: Married    Spouse name: Not on file  . Number of children: Not on file  . Years of education: Not on file  . Highest education level: Never attended school  Occupational History  . Not on file  Tobacco Use  . Smoking status: Never  . Smokeless tobacco: Never  Substance and Sexual Activity  . Alcohol use: Yes    Alcohol/week: 2.0 standard drinks of alcohol    Types: 2 Glasses of wine per week  . Drug use: Never  . Sexual activity: Not on file  Other Topics Concern  . Not on file  Social History Narrative  . Not on file   Social Determinants of Health   Financial Resource Strain: Unknown (03/23/2022)   Overall Financial Resource Strain (CARDIA)   . Difficulty of Paying Living Expenses: Patient refused  Food Insecurity: No Food Insecurity (03/23/2022)   Hunger Vital Sign   . Worried About Programme researcher, broadcasting/film/video in the Last Year: Never true   . Ran Out of Food in the Last Year: Never true  Transportation Needs: No  Transportation Needs (03/23/2022)   PRAPARE - Transportation   . Lack of Transportation (Medical): No   . Lack of Transportation (Non-Medical): No  Physical Activity: Unknown (03/23/2022)   Exercise Vital Sign   . Days of Exercise per Week: 0 days   . Minutes of Exercise per Session: Not on file  Stress: Unknown (03/23/2022)   [employer] of Occupational Health - Occupational Stress Questionnaire   . Feeling of Stress : Patient refused  Social Connections: Moderately Isolated (03/23/2022)   Social Connection and Isolation Panel [NHANES]   . Frequency of Communication with Friends and Family: Once a week   . Frequency of Social Gatherings with Friends and Family: Once a week   . Attends Religious Services: 1 to 4 times per year   . Active Member of Clubs or Organizations: No   . Attends Banker Meetings: Not on file   . Marital Status: Married    There were no vitals filed for this visit. There is no height or weight on file to calculate BMI.  Physical Exam  ASSESSMENT AND PLAN:  There are no diagnoses linked to this encounter.   No follow-ups on file.   Donnelle Rubey G. [place], MD  [facility]. [place] [facility].  Discharge Instructions   None

## 2022-06-03 ENCOUNTER — Ambulatory Visit (INDEPENDENT_AMBULATORY_CARE_PROVIDER_SITE_OTHER): Payer: Commercial Managed Care - HMO

## 2022-06-03 ENCOUNTER — Encounter: Payer: Self-pay | Admitting: Family Medicine

## 2022-06-03 ENCOUNTER — Ambulatory Visit (INDEPENDENT_AMBULATORY_CARE_PROVIDER_SITE_OTHER): Payer: Commercial Managed Care - HMO | Admitting: Family Medicine

## 2022-06-03 VITALS — BP 118/70 | HR 91 | Resp 16 | Ht 63.0 in | Wt 207.2 lb

## 2022-06-03 DIAGNOSIS — R053 Chronic cough: Secondary | ICD-10-CM

## 2022-06-03 DIAGNOSIS — G4730 Sleep apnea, unspecified: Secondary | ICD-10-CM | POA: Diagnosis not present

## 2022-06-03 MED ORDER — OMEPRAZOLE 40 MG PO CPDR: 40.0000 mg | DELAYED_RELEASE_CAPSULE | Freq: Every day | ORAL | 0 refills | Status: DC

## 2022-06-03 NOTE — Patient Instructions (Addendum)
A few things to remember from today's visit:  Chronic cough - Plan: DG Chest 2 View  Sleep apnea, unspecified type - Plan: Ambulatory referral to Sleep Studies  Do not use My Chart to request refills or for acute issues that need immediate attention.  If a new problem present, please set up appointment sooner than planned today.  Tos crnica a veces puede ser causada por reflujo del estmago, entonces vamos a empezar Omeprazol antes del desayuno. Si pierde peso [employer] a ayudar. Un estudio de sueno fue ordenado hoy. Por favor pida una cita para ver su doctor en 4-5 semanas. Tos en los adultos Cough, Adult La tos ayuda a despejar la garganta y los pulmones. La tos puede ser un signo de una enfermedad u otra afeccin mdica. Una tos aguda puede durar [place] 2 o 3 semanas, mientras que una tos crnica puede durar 8 semanas o ms [employer]. Hay muchas cosas que pueden causar tos. Estas incluyen lo siguiente: Grmenes (virus o bacterias) que atacan las vas respiratorias. Inhalacin de cosas que alteran (irritan) los pulmones. Alergias. Asma. Mucosidad que se desliza por la parte posterior de la garganta (goteo posnasal). Fumar. cido que vuelve desde el estmago hacia el tubo que transporta los alimentos desde la boca hasta el estmago (reflujo gastroesofgico). Algunos medicamentos. Problemas pulmonares. Otras enfermedades, como insuficiencia cardaca o un cogulo de sangre en el pulmn (embolia pulmonar). Siga estas instrucciones en su casa: Medicamentos Tome los medicamentos de venta libre y los recetados solamente como se lo haya indicado el mdico. Hable con el mdico antes de tomar medicamentos que detienen la tos (antitusivos). Estilo de vida  No fume y trate de no estar cerca de humo. No consuma ningn producto que contenga nicotina o tabaco, como cigarrillos, cigarrillos electrnicos y tabaco de Theatre manager. Si necesita ayuda para dejar de fumar, consulte al mdico. Beba  suficiente lquido para mantener el pis (la [place]) de color amarillo plido. Evite la cafena. No beba alcohol si el mdico se lo prohbe. Instrucciones generales  Fjese si hay algn cambio en la tos. Informe a su mdico sobre ellos. Siempre cbrase la boca al toser. Aljese de las cosas que lo hagan toser, como perfumes, velas, humo de fogatas o productos de limpieza. Si el aire es [employer], use un humidificador o un vaporizador de niebla fra en su hogar. Si la tos empeora por la noche, pruebe con usar almohadas adicionales para elevar la cabeza mientras duerme. Descanse todo lo que sea necesario. Concurra a todas las visitas de [address] se lo haya indicado el mdico. Esto es importante. Comunquese con un mdico si: Aparecen nuevos sntomas. Tose y escupe pus. La tos no mejora despus de 2 o 3 semanas o empeora. Los medicamentos para la tos no [employer] tos y usted no duerme bien. Siente un dolor que empeora o un dolor que no se alivia con medicamentos. Tiene fiebre. Est adelgazando y no sabe por qu. Transpira durante la noche. Solicite ayuda inmediatamente si: Tose y [employer]. Tiene dificultad para respirar. [employer] late [place] rpido. Estos sntomas pueden Customer service manager. No espere a ver si los sntomas desaparecen. Solicite atencin mdica de inmediato. Comunquese con el servicio de emergencias de su localidad (911 en los [place]). No conduzca por sus propios medios [employer]. Resumen La tos ayuda a despejar la garganta y los pulmones. Hay muchas cosas que pueden causar tos. Tome los medicamentos de venta libre y los recetados solamente  como se lo haya indicado el mdico. Siempre cbrase la boca al toser. Comunquese con un mdico si tiene sntomas nuevos o tiene una tos que no mejora o que [place]. Esta informacin no tiene Theme park manager el consejo del mdico. Asegrese de hacerle al mdico cualquier pregunta que  tenga. Document Revised: 01/11/2019 Document Reviewed: 01/11/2019 Elsevier Patient Education  2023 [employer].

## 2022-06-05 NOTE — Progress Notes (Unsigned)
HPI: Anthony Nichols is a 56 y.o. male, who is here today with his wife and daughter to establish care.  Former PCP: Dr. Jerilee Hoh Last preventive routine visit: 03/23/22 I saw him for acute visit on 06/03/22 for cough. Started on PPI, he is reporting improvement of cough but his wife does not think so. No new associated symptoms. Because loud snoring and witnessed episodes of apnea, sleep study order was placed.  Chronic medical problems: HLD,B12 deficiency, and vit D deficiency.  He is not taking B12 supplementation. Lab Results  Component Value Date   [id number] 03/23/2022   HLD: He is not on pharmacologic treatment. Lab Results  Component Value Date   CHOL 188 03/23/2022   HDL 33.00 (L) 03/23/2022   LDLDIRECT 101.0 03/23/2022   TRIG 322.0 (H) 03/23/2022   CHOLHDL 6 03/23/2022   Right ankle pain. He was evaluated in acute care on 06/01/22. Right ankle X ray negative for acute fracture or dislocation. Soft tissue swelling.  Dx'ed with achilles tendinitis. Naproxen for 10 days was recommended. Pain has improved some.  He had an foot injury a week before pain started.  Review of Systems  Constitutional:  Negative for activity change, appetite change and fever.  HENT:  Negative for mouth sores, nosebleeds and sore throat.   Respiratory:  Negative for shortness of breath and wheezing.   Cardiovascular:  Negative for chest pain, palpitations and leg swelling.  Gastrointestinal:  Negative for abdominal pain, nausea and vomiting.  Genitourinary:  Negative for decreased urine volume, dysuria and hematuria.  Skin:  Negative for rash.  Neurological:  Negative for syncope, weakness, numbness and headaches.  Rest see pertinent positives and negatives per HPI.  Current Outpatient Medications on File Prior to Visit  Medication Sig Dispense Refill   Omega-3 Fatty Acids (FISH OIL) 1000 MG CAPS Take 1 capsule (1,000 mg total) by mouth in the morning and at bedtime.  0    omeprazole (PRILOSEC) 40 MG capsule Take 1 capsule (40 mg total) by mouth daily before breakfast. 60 capsule 0   triamcinolone cream (KENALOG) 0.1 % Apply 1 application topically 2 (two) times daily. 30 g 0   vitamin B-12 (CYANOCOBALAMIN) 1000 MCG tablet Take 1 tablet (1,000 mcg total) by mouth daily.     Vitamin D, Ergocalciferol, (DRISDOL) 1.25 MG (50000 UNIT) CAPS capsule Take 1 capsule (50,000 Units total) by mouth every 7 (seven) days for 12 doses. 12 capsule 0   No current facility-administered medications on file prior to visit.   Past Medical History:  Diagnosis Date   Hyperlipidemia    No Known Allergies  Family History  Problem Relation Age of Onset   Diabetes Mother    Colon cancer Father    Colon cancer Brother    Social History   Socioeconomic History   Marital status: Married    Spouse name: Not on file   Number of children: Not on file   Years of education: Not on file   Highest education level: Never attended school  Occupational History   Not on file  Tobacco Use   Smoking status: Never   Smokeless tobacco: Never  Substance and Sexual Activity   Alcohol use: Yes    Alcohol/week: 2.0 standard drinks of alcohol    Types: 2 Glasses of wine per week   Drug use: Never   Sexual activity: Not on file  Other Topics Concern   Not on file  Social History Narrative   Not on  file   Social Determinants of Health   Financial Resource Strain: Unknown (03/23/2022)   Overall Financial Resource Strain (CARDIA)    Difficulty of Paying Living Expenses: Patient refused  Food Insecurity: No Food Insecurity (03/23/2022)   Hunger Vital Sign    Worried About Running Out of Food in the Last Year: Never true    Ran Out of Food in the Last Year: Never true  Transportation Needs: No Transportation Needs (03/23/2022)   PRAPARE - Administrator, Civil Service (Medical): No    Lack of Transportation (Non-Medical): No  Physical Activity: Unknown (03/23/2022)    Exercise Vital Sign    Days of Exercise per Week: 0 days    Minutes of Exercise per Session: Not on file  Stress: Unknown (03/23/2022)   [employer] of Occupational Health - Occupational Stress Questionnaire    Feeling of Stress : Patient refused  Social Connections: Moderately Isolated (03/23/2022)   Social Connection and Isolation Panel [NHANES]    Frequency of Communication with Friends and Family: Once a week    Frequency of Social Gatherings with Friends and Family: Once a week    Attends Religious Services: 1 to 4 times per year    Active Member of [employer] or Organizations: No    Attends Banker Meetings: Not on file    Marital Status: Married   Vitals:   06/08/22 1506  BP: 136/80  Pulse: 95  Resp: 12  SpO2: 98%   Wt Readings from Last 3 Encounters:  06/08/22 208 lb 8 oz (94.6 kg)  06/03/22 207 lb 4 oz (94 kg)  03/23/22 205 lb 8 oz (93.2 kg)   Body mass index is 36.93 kg/m.  Physical Exam Vitals and nursing note reviewed.  Constitutional:      General: He is not in acute distress.    Appearance: He is well-developed.  HENT:     Head: Normocephalic and atraumatic.  Eyes:     Conjunctiva/sclera: Conjunctivae normal.  Cardiovascular:     Rate and Rhythm: Normal rate and regular rhythm.     Pulses:          Dorsalis pedis pulses are 2+ on the right side and 2+ on the left side.     Heart sounds: No murmur heard.    Comments: Varicose veins LE, bilateral. Pulmonary:     Effort: Pulmonary effort is normal. No respiratory distress.     Breath sounds: Normal breath sounds.  Abdominal:     Palpations: Abdomen is soft. There is no hepatomegaly or mass.     Tenderness: There is no abdominal tenderness.  Musculoskeletal:     Right ankle: Swelling (Mild.) and deformity (High arch) present. Decreased range of motion (Mild). Normal pulse.     Right Achilles Tendon: Tenderness present. No defects. Thompson's test negative.  Lymphadenopathy:     Cervical: No  cervical adenopathy.  Skin:    General: Skin is warm.     Findings: No erythema or rash.  Neurological:     Mental Status: He is alert and oriented to person, place, and time.     Cranial Nerves: No cranial nerve deficit.     Gait: Gait normal.  Psychiatric:     Comments: Well groomed, good eye contact.   ASSESSMENT AND PLAN:  Mr.Reubin was seen today for establish care.  Diagnoses and all orders for this visit:  Right ankle pain, unspecified chronicity Some side effects of NSAID's discussed. ABC ankle exercises 1-2 times  per week recommended. Recommend establish with podiatrist, may need costume inserts.  Chronic cough He is reporting some improvement. CXR negative for acute pulmonary disease. Continue Omeprazole 40 mg daily.  Vitamin B12 deficiency He is not on B12 supplementation. Start B12 2000 mcg once per week.  Return in about 2 months (around 08/09/2022) for cough,wt,HLD.  Mikailah Morel G. [place], MD  [facility]. [place] office.

## 2022-06-08 ENCOUNTER — Ambulatory Visit (INDEPENDENT_AMBULATORY_CARE_PROVIDER_SITE_OTHER): Payer: Commercial Managed Care - HMO | Admitting: Family Medicine

## 2022-06-08 ENCOUNTER — Encounter: Payer: Self-pay | Admitting: Family Medicine

## 2022-06-08 VITALS — BP 136/80 | HR 95 | Resp 12 | Ht 63.0 in | Wt 208.5 lb

## 2022-06-08 DIAGNOSIS — R053 Chronic cough: Secondary | ICD-10-CM | POA: Diagnosis not present

## 2022-06-08 DIAGNOSIS — M25571 Pain in right ankle and joints of right foot: Secondary | ICD-10-CM

## 2022-06-08 DIAGNOSIS — E538 Deficiency of other specified B group vitamins: Secondary | ICD-10-CM

## 2022-06-08 NOTE — Patient Instructions (Addendum)
A few things to remember from today's visit:  Right foot pain  Chronic cough  Vitamin B12 deficiency  If you need refills please call your pharmacy. Do not use My Chart to request refills or for acute issues that need immediate attention.   Pendiente examen del sueo. Tome B12 2000 mcg una vez por semana.  Please be sure medication list is accurate. If a new problem present, please set up appointment sooner than planned today.

## 2022-10-28 NOTE — Progress Notes (Signed)
ACUTE VISIT Chief Complaint  Patient presents with   Hearing Loss   HPI: [name] is a 56 y.o. male, who is here today with his wife complaining of sudden onset of hearing loss 2 weeks ago. His wife states that she has noted some hearing loss for 2 years but he denies having problem before. Problem is constant.  He has not noted nasal congestion,rhinorrhea,earache,or ear drainage.  He denies any recent respiratory infections, travel to high altitudes, or airplane travel. He occasionally uses cotton swabs but reports no wax in the affected ear.  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 He reports no tinnitus or other unusual sounds in the ear.  In addition to the hearing loss, the patient expresses interest in a vasectomy and inquires about weight loss medication. He has not been consistent with following a healthful diet and does not exercise regularly.  Review of Systems  Constitutional:  Negative for chills and fever.  Respiratory:  Negative for cough, shortness of breath and wheezing.   Cardiovascular:  Negative for chest pain and palpitations.  Gastrointestinal:  Negative for abdominal pain, nausea and vomiting.  Endocrine: Negative for cold intolerance and heat intolerance.  Neurological:  Negative for syncope and weakness.  See other pertinent positives and negatives in HPI.  Current Outpatient Medications on File Prior to Visit  Medication Sig Dispense Refill   Omega-3 Fatty Acids (FISH OIL) 1000 MG CAPS Take 1 capsule (1,000 mg total) by mouth in the morning and at bedtime.  0   triamcinolone cream (KENALOG) 0.1 % Apply 1 application topically 2 (two) times daily. 30 g 0   vitamin B-12 (CYANOCOBALAMIN) 1000 MCG tablet Take 1 tablet (1,000 mcg total) by mouth daily.     omeprazole (PRILOSEC) 40 MG capsule Take 1 capsule (40 mg total) by mouth daily before breakfast. 60  capsule 0   No current facility-administered medications on file prior to visit.   Past Medical History:  Diagnosis Date   Hyperlipidemia    No Known Allergies  Social History   Socioeconomic History   Marital status: Married    Spouse name: Not on file   Number of children: Not on file   Years of education: Not on file   Highest education level: Never attended school  Occupational History   Not on file  Tobacco Use   Smoking status: Never   Smokeless tobacco: Never  Substance and Sexual Activity   Alcohol use: Yes    Alcohol/week: 2.0 standard drinks of alcohol    Types: 2 Glasses of wine per week   Drug use: Never   Sexual activity: Not on file  Other Topics Concern   Not on file  Social History Narrative   Not on file   Social Determinants of Health   Financial Resource Strain: Unknown (03/23/2022)   Overall Financial Resource Strain (CARDIA)    Difficulty of Paying Living Expenses: Patient refused  Food Insecurity: No Food Insecurity (03/23/2022)   Hunger Vital Sign    Worried About Running Out of Food in the Last Year: Never true    Ran Out of Food in the Last Year: Never true  Transportation Needs: No Transportation Needs (03/23/2022)   PRAPARE - Administrator, Civil Service (Medical): No    Lack of Transportation (Non-Medical): No  Physical Activity: Unknown (03/23/2022)   Exercise Vital Sign  Days of Exercise per Week: 0 days    Minutes of Exercise per Session: Not on file  Stress: Unknown (03/23/2022)   [employer] of Occupational Health - Occupational Stress Questionnaire    Feeling of Stress : Patient refused  Social Connections: Moderately Isolated (03/23/2022)   Social Connection and Isolation Panel [NHANES]    Frequency of Communication with Friends and Family: Once a week    Frequency of Social Gatherings with Friends and Family: Once a week    Attends Religious Services: 1 to 4 times per year    Active Member of [employer] or  Organizations: No    Attends Banker Meetings: Not on file    Marital Status: Married    Vitals:   10/30/22 1518  BP: 120/70  Pulse: 85  Resp: 16  SpO2: 97%   Body mass index is 37.07 kg/m.  Physical Exam Vitals and nursing note reviewed.  Constitutional:      General: He is not in acute distress.    Appearance: He is well-developed. He is not ill-appearing.  HENT:     Head: Normocephalic and atraumatic.     Right Ear: Tympanic membrane, ear canal and external ear normal. Decreased hearing noted. No mastoid tenderness.     Left Ear: Tympanic membrane, ear canal and external ear normal. No mastoid tenderness.     Nose: Septal deviation and congestion present. No rhinorrhea.  Eyes:     Conjunctiva/sclera: Conjunctivae normal.  Cardiovascular:     Rate and Rhythm: Normal rate and regular rhythm.     Heart sounds: No murmur heard. Pulmonary:     Effort: Pulmonary effort is normal. No respiratory distress.     Breath sounds: Normal breath sounds. No stridor.  Lymphadenopathy:     Cervical: No cervical adenopathy.  Skin:    General: Skin is warm.     Findings: No erythema or rash.  Neurological:     Mental Status: He is alert and oriented to person, place, and time.  Psychiatric:        Mood and Affect: Mood and affect normal.   ASSESSMENT AND PLAN:  [name] is a 56 yo male that was seen today for hearing loss. Orders Placed This Encounter  Procedures   Ambulatory referral to Urology   Amb Ref to Medical Weight Management   Ambulatory referral to ENT   Sudden right hearing loss He is reporting sudden onset. Examination today without evident abnormality. Prednisone recommended,side effects discussed. Urgent ENT referral placed.  Hearing Screening   500Hz  1000Hz  2000Hz  4000Hz   Right ear Fail Fail Fail Fail  Left ear Fail Fail Pass Pass   -     predniSONE; 3 tabs daily for 10 days then 2 tabs for 8 days then 1 tab for 5 days, then 1/2 tab for 3  days, then stop.  Dispense: 53 tablet; Refill: 0 -     Ambulatory referral to ENT  Obesity (BMI 35.0-39.9 without comorbidity) Consistency with healthy diet and physical activity encouraged. Making positive life style changes, small steps at the time, will provide long lasting results with minimal risk for adverse effects; so I do not recommend pharmacologic treatment. He would like referral to wt loss clinic.  Encounter for sterilization Referred to urologist for evaluation and potential vasectomy procedure.  Return if symptoms worsen or fail to improve, for keep next appointment.  Anthony Klinck G. , MD  [facility] [place] [facility].

## 2022-10-30 ENCOUNTER — Encounter: Payer: Self-pay | Admitting: Family Medicine

## 2022-10-30 ENCOUNTER — Ambulatory Visit (INDEPENDENT_AMBULATORY_CARE_PROVIDER_SITE_OTHER): Payer: Commercial Managed Care - HMO | Admitting: Family Medicine

## 2022-10-30 VITALS — BP 120/70 | HR 85 | Resp 16 | Ht 63.0 in | Wt 209.2 lb

## 2022-10-30 DIAGNOSIS — Z302 Encounter for sterilization: Secondary | ICD-10-CM

## 2022-10-30 DIAGNOSIS — H9121 Sudden idiopathic hearing loss, right ear: Secondary | ICD-10-CM

## 2022-10-30 DIAGNOSIS — E669 Obesity, unspecified: Secondary | ICD-10-CM

## 2022-10-30 MED ORDER — PREDNISONE 20 MG PO TABS: ORAL_TABLET | ORAL | 0 refills | Status: DC

## 2022-10-30 NOTE — Patient Instructions (Signed)
A few things to remember from today's visit:  Sudden right hearing loss - Plan: predniSONE (DELTASONE) 20 MG tablet, Ambulatory referral to ENT, CANCELED: Ambulatory referral to ENT  Obesity (BMI 35.0-39.9 without comorbidity) - Plan: Amb Ref to Medical Weight Management  Encounter for sterilization - Plan: Ambulatory referral to Urology Hoy vamos a empezar dosis alta de prednisona, 3 tab diarias por 10 das y despus empieza a bajar la dosis despacio come dice en l prescripcin. Yo lo rferi al especialista urgente. Tome la prednisona con desayuno.   If you need refills for medications you take chronically, please call your pharmacy. Do not use My Chart to request refills or for acute issues that need immediate attention. If you send a my chart message, it may take a few days to be addressed, specially if I am not in the office.  Please be sure medication list is accurate. If a new problem present, please set up appointment sooner than planned today.

## 2022-11-09 NOTE — Progress Notes (Deleted)
11/10/2022 7:47 AM   Tally Due Lacretia Leigh Dec 16, 1965 [id number]  Referring provider: [place], Betty G, MD [address] [place],  [place] [address]  No chief complaint on file.   HPI: 56 y.o. year old male referred for further evaluation of possible vasectomy.  He denies a history of testicular trauma or pain.  No urinary issues.  No previous scrotal surgeries.   PMH: Past Medical History:  Diagnosis Date   Hyperlipidemia     Surgical History: No past surgical history on file.  Home Medications:  Allergies as of 11/10/2022   No Known Allergies      Medication List        Accurate as of November 09, 2022  7:47 AM. If you have any questions, ask your nurse or doctor.          cyanocobalamin 1000 MCG tablet Commonly known as: VITAMIN B12 Take 1 tablet (1,000 mcg total) by mouth daily.   Fish Oil 1000 MG Caps Take 1 capsule (1,000 mg total) by mouth in the morning and at bedtime.   omeprazole 40 MG capsule Commonly known as: PRILOSEC Take 1 capsule (40 mg total) by mouth daily before breakfast.   predniSONE 20 MG tablet Commonly known as: DELTASONE 3 tabs daily for 10 days then 2 tabs for 8 days then 1 tab for 5 days, then 1/2 tab for 3 days, then stop.   triamcinolone cream 0.1 % Commonly known as: KENALOG Apply 1 application topically 2 (two) times daily.        Allergies: No Known Allergies  Family History: Family History  Problem Relation Age of Onset   Diabetes Mother    Colon cancer Father    Colon cancer Brother     Social History:  reports that he has never smoked. He has never used smokeless tobacco. He reports current alcohol use of about 2.0 standard drinks of alcohol per week. He reports that he does not use drugs.   Physical Exam: There were no vitals taken for this visit.  Constitutional:  Alert and oriented, No acute distress. HEENT: [place] AT, moist mucus membranes.  Trachea midline, no masses. Cardiovascular: No  clubbing, cyanosis, or edema. Respiratory: Normal respiratory effort, no increased work of breathing. GI: Abdomen is soft, nontender, nondistended, no abdominal masses GU: Normal phallus.  Bilateral descended testicles without masses.  Vasa easily palpable bilaterally. Skin: No rashes, bruises or suspicious lesions. Neurologic: Grossly intact, no focal deficits, moving all 4 extremities. Psychiatric: Normal mood and affect.   Assessment & Plan:    1. Vasectomy evaluation Today, we discussed what the vas deferens is, where it is located, and its function. We reviewed the procedure for vasectomy, it's risks, benefits, alternatives, and likelihood of achieving his goals. We discussed in detail the procedure, complications, and recovery as well as the need for clearance prior to unprotected intercourse. We discussed that vasectomy does not protect against sexually transmitted diseases. We discussed that this procedure does not result in immediate sterility and that they would need to use other forms of birth control until he has been cleared with negative postvasectomy semen analyses. I explained that the procedure is considered to be permanent and that attempts at reversal have varying degrees of success. These options include vasectomy reversal, sperm retrieval, and in vitro fertilization; these can be very expensive. We discussed the chance of postvasectomy pain syndrome which occurs in less than 5% of patients. I explained to the patient that there is no treatment  to resolve this chronic pain, and that if it developed I would not be able to help resolve the issue, but that surgery is generally not needed for correction. I explained there have even been reports of systemic like illness associated with this chronic pain, and that there was no good cure. I explained that vasectomy it is not a 100% reliable form of birth control, and the risk of pregnancy after vasectomy is approximately 1 in 2000 men who  had a negative postvasectomy semen analysis or rare non-motile sperm. I explained that repeat vasectomy was necessary in less than 1% of vasectomy procedures when employing the type of technique that I use. I explained that he should refrain from ejaculation for approximately one week following vasectomy. I explained that there are other options for birth control which are permanent and non-permanent; we discussed these. I explained the rates of surgical complications, such as symptomatic hematoma or infection, are low (1-2%) and vary with the surgeon's experience and criteria used to diagnose the complication.   The patient had the opportunity to ask questions to his stated satisfaction. He voiced understanding of the above factors and stated that he has read all the information provided to him and the packets and informed consent.  He is interested in receiving of Valium 10 mg prior to the procedure for the purpose of anxiolysis.  A prescription was given today.  He will have a driver on the day of the procedure.   No follow-ups on file.  Vanna Scotland, MD  [facility] [address] [place], [place] [address] [phone]

## 2022-11-10 ENCOUNTER — Ambulatory Visit: Payer: Self-pay | Admitting: Urology

## 2022-11-13 ENCOUNTER — Ambulatory Visit (INDEPENDENT_AMBULATORY_CARE_PROVIDER_SITE_OTHER): Payer: Commercial Managed Care - HMO | Admitting: Urology

## 2022-11-13 ENCOUNTER — Encounter: Payer: Self-pay | Admitting: Urology

## 2022-11-13 VITALS — BP 133/91 | HR 97 | Ht 63.0 in | Wt 209.4 lb

## 2022-11-13 DIAGNOSIS — Z3009 Encounter for other general counseling and advice on contraception: Secondary | ICD-10-CM | POA: Diagnosis not present

## 2022-11-13 MED ORDER — DIAZEPAM 10 MG PO TABS: 10.0000 mg | ORAL_TABLET | Freq: Once | ORAL | 0 refills | Status: AC

## 2022-11-13 NOTE — Patient Instructions (Addendum)
Pre-Vasectomy Instructions  STOP all aspirin or blood thinners (Aspirin, Plavix, Coumadin, Warfarin, Motrin, Ibuprofen, Advil, Aleve, Naproxen, Naprosyn) for 7 days prior to the procedure.  If you have any questions about stopping these medications please contact your primary care physician or cardiologist.  Shave all hair from the upper scrotum on the day of the procedure.  This means just under the penis onto the scrotal sac.  The area shaved should measure about 2-3 inches around.  You may lather the scrotum with soap and water, and shave with a safety razor.  After shaving the area, thoroughly wash the penis and the scrotum, then shower or bathe to remove all the loose hairs.  If needed, wash the area again just before coming in for your Vasectomy.  It is recommended to have a light meal an hour or so prior to the procedure.  Bring a scrotal support (jock strap or suspensory, or tight jockey shorts or underwear).  Wear comfortable pants or shorts.  While the actual procedure usually takes about 45 minutes, you should be prepared to stay in the office for approximately one hour.  Bring someone with you to drive you home.  If you have any questions or concerns, please feel free to call the office at [phone].  Vasectoma Vasectomy La vasectoma es un procedimiento en el que se corta el conducto deferente y luego se ata o quema (cauteriza). El conducto deferente es un conducto que transporta los espermatozoides desde el testculo hasta la parte del cuerpo que saca la orina de la vejiga (uretra). El procedimiento impide que el esperma pase a travs del conducto deferente y del pene durante la eyaculacin. Esto garantiza que el esperma no ingrese en la vagina durante el sexo. La vasectoma no afecta el deseo ni el desempeo sexual, ni evita el contagio de enfermedades de transmisin sexual. La vasectoma se considera una forma permanente y [place] eficaz de control de la natalidad  (anticonceptivo). La decisin de realizarse una vasectoma no debe tomarse durante una situacin estresante, como despus de la prdida de un embarazo o un divorcio. Usted y su pareja deben decidir la realizacin de la vasectoma cuando estn seguros de que no querrn tener hijos en el futuro. Informe al mdico acerca de lo siguiente: Cualquier alergia que tenga. Todos los [employer] [place], incluidos vitaminas, hierbas, gotas oftlmicas, cremas y [address] de [address]. Cualquier problema previo que usted o algn miembro de su familia hayan tenido con los anestsicos. Cualquier trastorno de la sangre que tenga. Cirugas a las que se haya sometido. Cualquier afeccin mdica que tenga. Cules son los riesgos? En general, se trata de un procedimiento seguro. Sin embargo, pueden ocurrir complicaciones, por ejemplo: Infeccin. Hemorragia e hinchazn en el escroto. El escroto es la bolsa que contiene los testculos, los vasos sanguneos y las estructuras que transportan el esperma y el semen. Reacciones alrgicas a los medicamentos. Falla del procedimiento para prevenir un embarazo. Hay una probabilidad muy pequea de que los extremos atados o cauterizados del conducto deferente puedan volver a conectarse (recanalizacin). En ese caso, podra dejar embarazada a una mujer. Dolor en el escroto que contina tras la recuperacin del procedimiento. Qu ocurre antes del procedimiento? Medicamentos Consulte al mdico sobre: Multimedia programmer o suspender los medicamentos que [place] habitualmente. Esto es muy importante si toma medicamentos para la diabetes o anticoagulantes. Tomar medicamentos como aspirina e ibuprofeno. Estos medicamentos pueden tener un efecto anticoagulante en la [place]. No tome estos medicamentos a menos que el mdico se  lo indique. Usar medicamentos de venta libre, vitaminas, hierbas y suplementos. Podrn administrarle una medicacin que lo ayudar a relajarse (sedante) unas horas antes  del procedimiento. Instrucciones generales No consuma ningn producto que contenga nicotina ni tabaco durante al [employer] las 4 semanas anteriores al procedimiento. Estos productos incluyen cigarrillos, cigarrillos electrnicos y tabaco para Theatre manager. Si necesita ayuda para dejar de fumar, consulte al mdico. Haga que un adulto responsable lo lleve a su casa desde el hospital o la clnica. Si va a marcharse a su casa inmediatamente despus del procedimiento, pdale a un adulto responsable que lo cuide durante el tiempo que le indiquen. Esto es importante. Pregntele al mdico: Cmo se Forensic psychologist de la Leisure centre manager. Qu medidas se tomarn para evitar una infeccin. Estas medidas pueden incluir las siguientes: Rasurar el vello del lugar de la [place]. Lavar la piel con un jabn antisptico. Recibir antibiticos. Qu ocurre durante el procedimiento?  Le administrarn uno o ms de los siguientes medicamentos: Un sedante, a menos que le hubieran dicho lo tomara unas horas antes del procedimiento. Un medicamento para adormecer la zona (anestesia local). El mdico le palpar el conducto deferente. Para llegar al conducto deferente, puede usarse uno de dos mtodos: Una incisin muy pequea en el escroto. Una puncin en el escroto, sin realizar una incisin. El conducto deferente se sacar del escroto y luego se cortar. Luego, se cerrarn los conductos deferentes de una de [place] formas: Se atarn en los extremos. Se cauterizarn en los extremos para sellarlos. El conducto deferente volver a colocarse dentro del escroto. La incisin o la puncin se cerrarn con puntos (suturas) reabsorbibles. Las suturas se disolvern con el [place], y no ser necesario extraerlas luego del procedimiento. El procedimiento se repetir en el otro lado del escroto. El procedimiento puede variar segn el mdico y el hospital. [place] ocurre despus del procedimiento? Lo controlarn para asegurarse de que no tenga ningn  problema. Le pedirn que no eyacule durante, al menos, 1 semana despus del procedimiento o el tiempo que le indiquen. Deber usar otro mtodo anticonceptivo durante 2 a 4 meses despus del procedimiento, Teacher, adult education que se confirme mediante estudios que ya no haya espermatozoides en el semen. Es posible que le indiquen el uso de un soporte escrotal, como un suspensorio o ropa interior con una bolsa con soporte. Si le administraron un sedante durante el procedimiento, puede afectarle por varias horas. No conduzca ni opere maquinaria hasta que el mdico le indique que es seguro [place]. Resumen La vasectoma bloquea la liberacin de espermatozoides Teacher, English as a foreign language. La vasectoma se considera una forma permanente y [place] de control de la natalidad. Con un medicamento, le adormecern el escroto (anestesia local) para el procedimiento. Luego del procedimiento, le pedirn que no eyacule durante, al menos, 1 semana o el tiempo que le indiquen. Deber usar otro mtodo anticonceptivo hasta que se confirme mediante estudios que ya no haya espermatozoides en el semen. Esta informacin no tiene Theme park manager el consejo del mdico. Asegrese de hacerle al mdico cualquier pregunta que tenga. Document Revised: 05/29/2020 Document Reviewed: 05/29/2020 Elsevier Patient Education  2023 [employer].

## 2022-11-13 NOTE — Progress Notes (Signed)
11/13/2022 4:40 PM   Anthony Nichols 11-12-1966 [id number]  Referring provider: [place], Betty G, MD [address] [place],  [place] [address]  Chief Complaint  Patient presents with   VAS Consult    HPI: 56 y.o. year old male referred for further evaluation of possible vasectomy.  He denies a history of testicular trauma or pain.  No urinary issues.  No previous scrotal surgeries.  Accompanied today by wife and daughter.  Spanish translator used via Therapist, occupational.   PMH: Past Medical History:  Diagnosis Date   Hyperlipidemia     Surgical History: No past surgical history on file.  Home Medications:  Allergies as of 11/13/2022   No Known Allergies      Medication List        Accurate as of November 13, 2022  4:40 PM. If you have any questions, ask your nurse or doctor.          STOP taking these medications    omeprazole 40 MG capsule Commonly known as: PRILOSEC Stopped by: Vanna Scotland, MD   triamcinolone cream 0.1 % Commonly known as: KENALOG Stopped by: Vanna Scotland, MD       TAKE these medications    cyanocobalamin 1000 MCG tablet Commonly known as: VITAMIN B12 Take 1 tablet (1,000 mcg total) by mouth daily.   Fish Oil 1000 MG Caps Take 1 capsule (1,000 mg total) by mouth in the morning and at bedtime.   predniSONE 20 MG tablet Commonly known as: DELTASONE 3 tabs daily for 10 days then 2 tabs for 8 days then 1 tab for 5 days, then 1/2 tab for 3 days, then stop.        Allergies: No Known Allergies  Family History: Family History  Problem Relation Age of Onset   Diabetes Mother    Colon cancer Father    Colon cancer Brother     Social History:  reports that he has never smoked. He has never used smokeless tobacco. He reports current alcohol use of about 2.0 standard drinks of alcohol per week. He reports that he does not use drugs.   Physical Exam: BP (!) 133/91 (BP Location: Left Arm, Patient Position:  Sitting, Cuff Size: Large)   Pulse 97   Ht 5\' 3"  (1.6 m)   Wt 209 lb 6.4 oz (95 kg)   BMI 37.09 kg/m   Constitutional:  Alert and oriented, No acute distress. HEENT: [place] AT, moist mucus membranes.  Trachea midline, no masses. Cardiovascular: No clubbing, cyanosis, or edema. Respiratory: Normal respiratory effort, no increased work of breathing. GI: Abdomen is soft, nontender, nondistended, no abdominal masses GU: Normal phallus.  Bilateral descended testicles without masses.  Vasa easily palpable bilaterally. Skin: No rashes, bruises or suspicious lesions. Neurologic: Grossly intact, no focal deficits, moving all 4 extremities. Psychiatric: Normal mood and affect.   Assessment & Plan:    1. Vasectomy evaluation Today, we discussed what the vas deferens is, where it is located, and its function. We reviewed the procedure for vasectomy, it's risks, benefits, alternatives, and likelihood of achieving his goals. We discussed in detail the procedure, complications, and recovery as well as the need for clearance prior to unprotected intercourse. We discussed that vasectomy does not protect against sexually transmitted diseases. We discussed that this procedure does not result in immediate sterility and that they would need to use other forms of birth control until he has been cleared with negative postvasectomy semen analyses. I explained that  the procedure is considered to be permanent and that attempts at reversal have varying degrees of success. These options include vasectomy reversal, sperm retrieval, and in vitro fertilization; these can be very expensive. We discussed the chance of postvasectomy pain syndrome which occurs in less than 5% of patients. I explained to the patient that there is no treatment to resolve this chronic pain, and that if it developed I would not be able to help resolve the issue, but that surgery is generally not needed for correction. I explained there have even been  reports of systemic like illness associated with this chronic pain, and that there was no good cure. I explained that vasectomy it is not a 100% reliable form of birth control, and the risk of pregnancy after vasectomy is approximately 1 in 2000 men who had a negative postvasectomy semen analysis or rare non-motile sperm. I explained that repeat vasectomy was necessary in less than 1% of vasectomy procedures when employing the type of technique that I use. I explained that he should refrain from ejaculation for approximately one week following vasectomy. I explained that there are other options for birth control which are permanent and non-permanent; we discussed these. I explained the rates of surgical complications, such as symptomatic hematoma or infection, are low (1-2%) and vary with the surgeon's experience and criteria used to diagnose the complication.   The patient had the opportunity to ask questions to his stated satisfaction. He voiced understanding of the above factors and stated that he has read all the information provided to him and the packets and informed consent.  He is interested in receiving of Valium 10 mg prior to the procedure for the purpose of anxiolysis.  A prescription was given today.  He will have a driver on the day of the procedure.    Vanna Scotland, MD  [facility] [address], [address] [place], [place] [address] [phone]

## 2022-12-06 ENCOUNTER — Encounter: Payer: Self-pay | Admitting: Family Medicine

## 2022-12-11 IMAGING — DX DG KNEE AP/LAT W/ SUNRISE*R*
3 series · 3 of 3 positions shown · non-contrast
Comparison: None.

CLINICAL DATA: Right-sided knee pain

EXAM:
RIGHT KNEE 3 VIEWS

[knee ap]
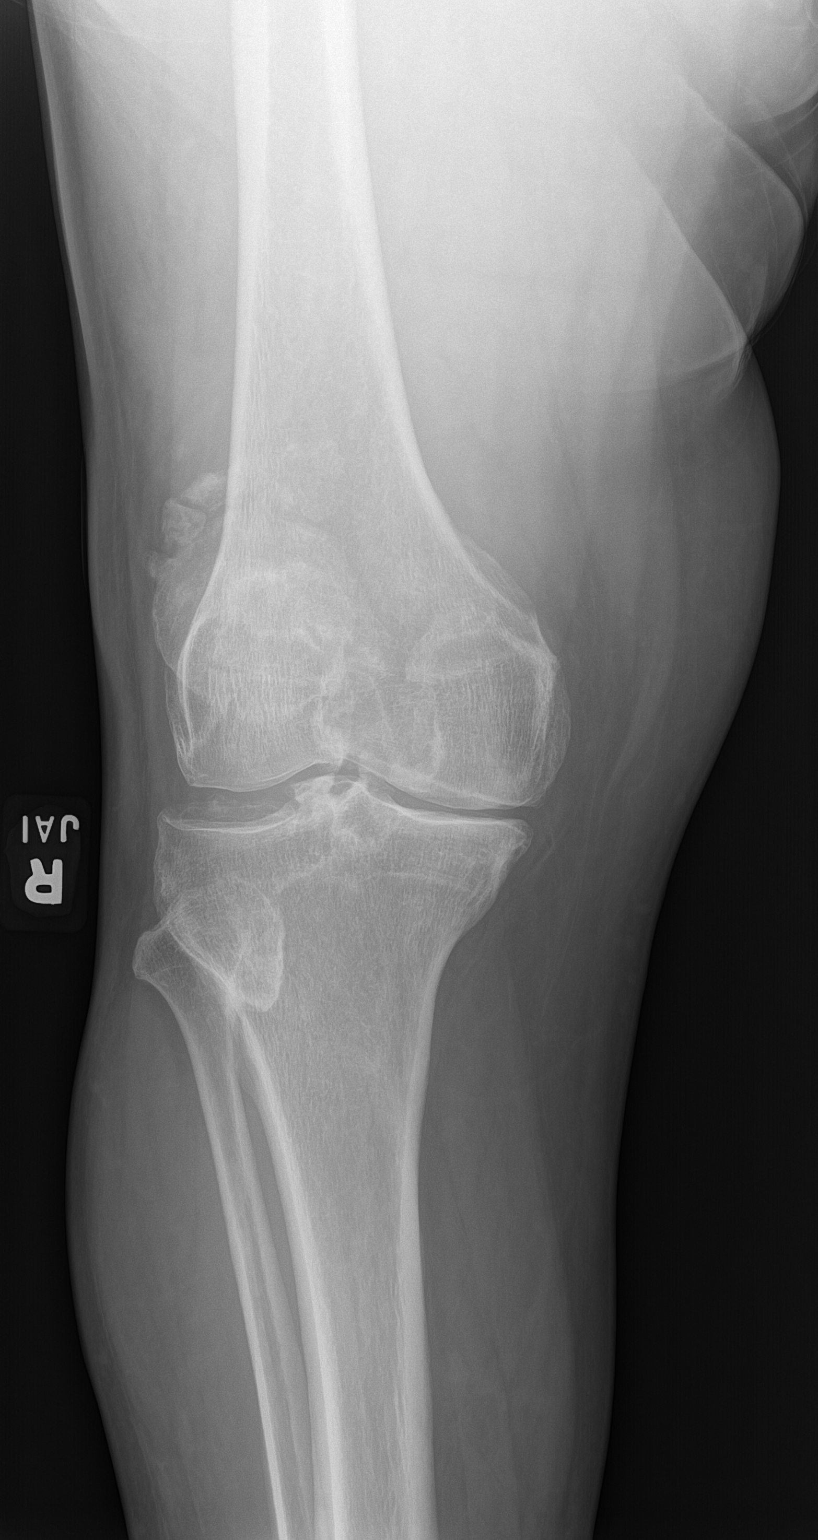

[knee lat]
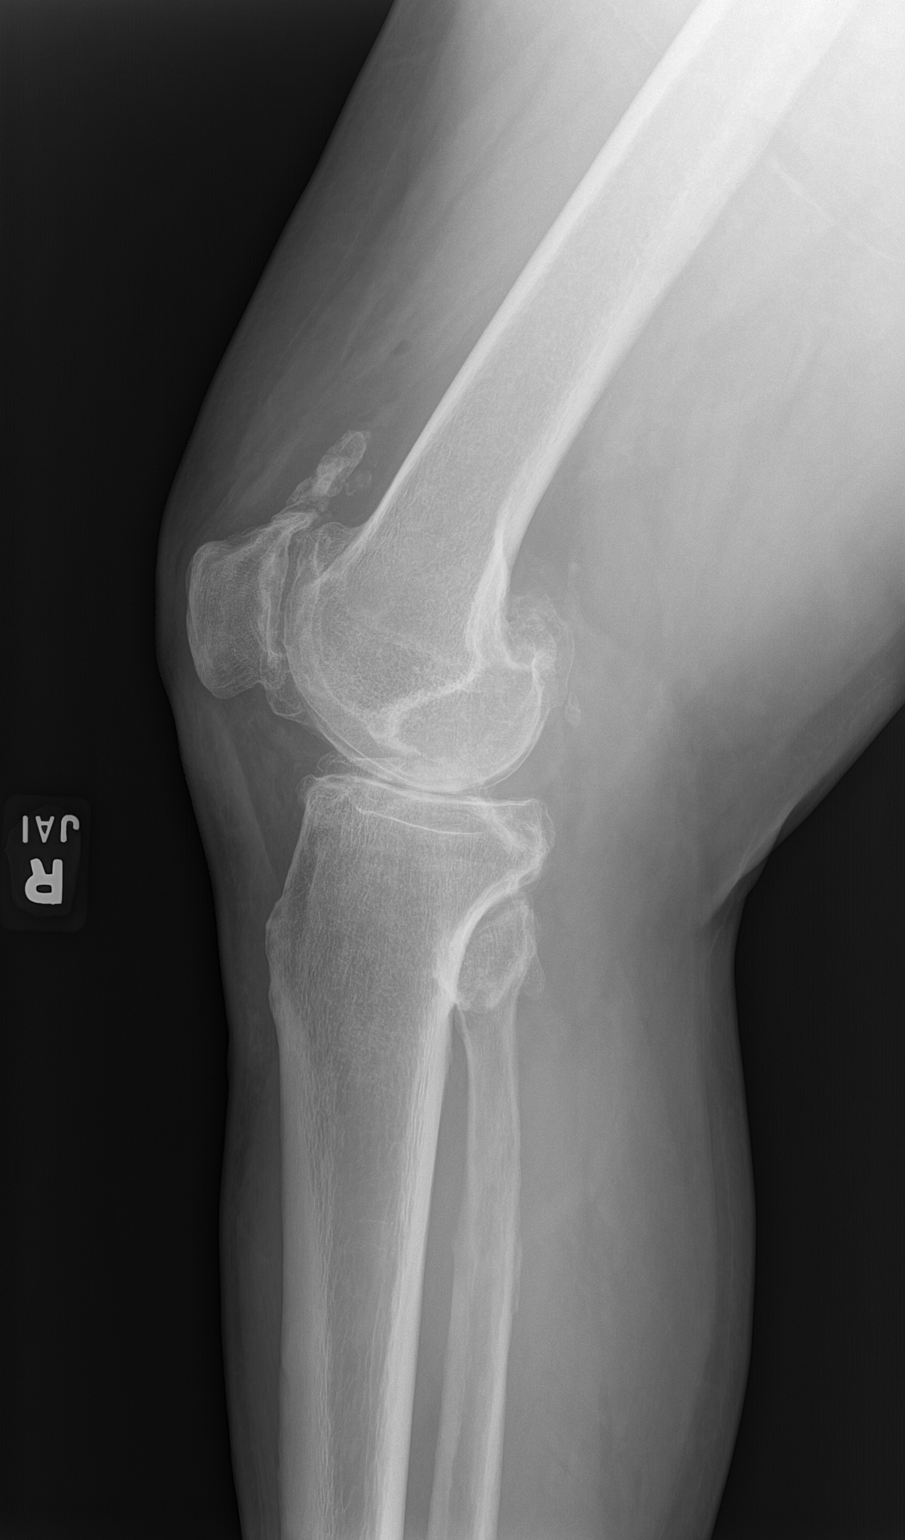

[patella]
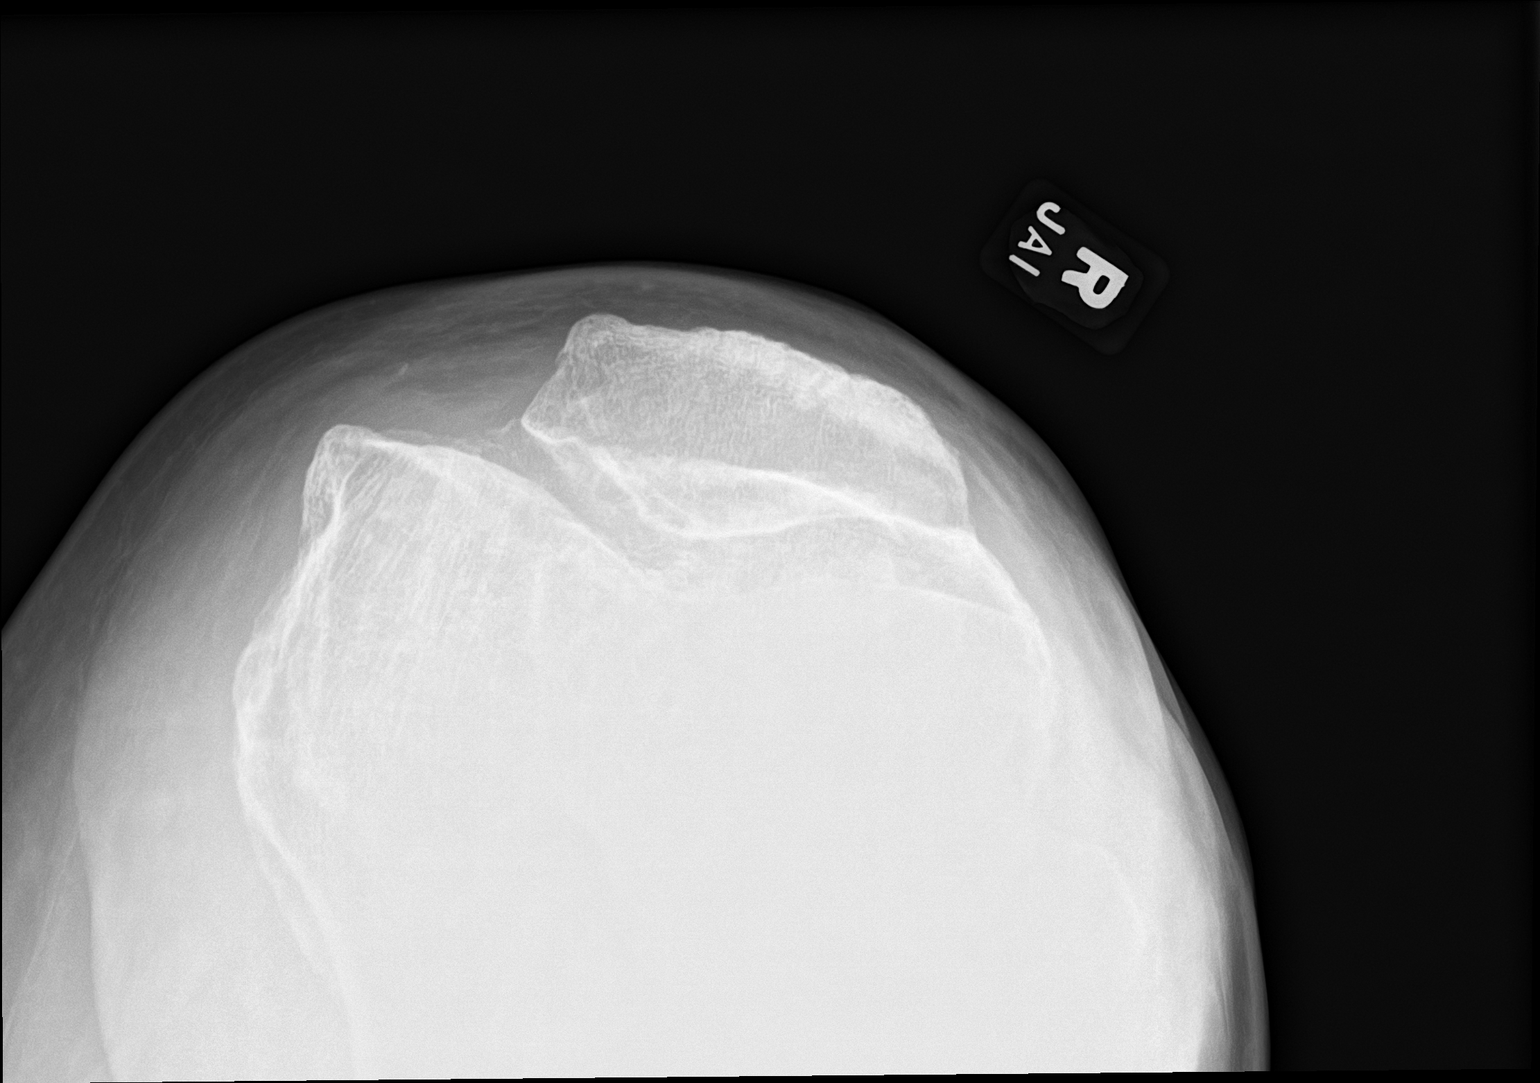

[3 of 3 positions shown; findings below may reference images not displayed]

FINDINGS: No fracture or malalignment. Tricompartment arthritis with moderate
severe degenerative changes involving the medial and patellofemoral
joint spaces. Multiple calcified loose bodies in the suprapatellar
region. Joint space calcification consistent with chondrocalcinosis
IMPRESSION: 1. Tricompartment arthritis with probable calcified loose bodies
2. Chondrocalcinosis

## 2022-12-21 ENCOUNTER — Other Ambulatory Visit: Payer: Self-pay | Admitting: Family Medicine

## 2022-12-21 DIAGNOSIS — H9121 Sudden idiopathic hearing loss, right ear: Secondary | ICD-10-CM

## 2023-01-15 ENCOUNTER — Encounter: Payer: Self-pay | Admitting: Urology

## 2023-05-18 NOTE — Telephone Encounter (Signed)
disregard

## 2023-05-24 NOTE — Progress Notes (Unsigned)
HPI: [name] is a 57 y.o.male here today for his routine physical examination.  Last CPE: 03/23/22  Anthony Nichols, a 57 year old patient, presents today for a physical examination. He reports an inconsistent exercise routine, sometimes visiting the gym once or twice a week. Regarding his dietary habits, he states that he primarily eats at home, but his meals do not include daily vegetable consumption. His diet consists of a mix of meat and poultry, with fish intake occurring approximately once or twice a week. He reports sleeping for approximately 7-8 hours per night and denies any history of smoking.  His alcohol consumption includes three beers and one to two glasses of wine per week.  Immunization History  Administered Date(s) Administered   Tdap 04/22/2021   Zoster Recombinat (Shingrix) 04/22/2021, 03/23/2022   Health Maintenance  Topic Date Due   Colonoscopy  Never done   COVID-19 Vaccine (1) 06/04/2023 (Originally 10/19/1966)   INFLUENZA VACCINE  07/01/2023   DTaP/Tdap/Td (2 - Td or Tdap) 04/23/2031   Hepatitis C Screening  Completed   HIV Screening  Completed   Zoster Vaccines- Shingrix  Completed   HPV VACCINES  Aged Out   Last prostate ca screening: Nocturia x 0-1. Lab Results  Component Value Date   PSA 0.49 03/23/2022   HLD on non pharmacologic treatment. Lab Results  Component Value Date   CHOL 188 03/23/2022   HDL 33.00 (L) 03/23/2022   LDLDIRECT 101.0 03/23/2022   TRIG 322.0 (H) 03/23/2022   CHOLHDL 6 03/23/2022   Currently, he is taking omega-3 and vitamin B12 supplements.  Lab Results  Component Value Date   VITAMINB12 283 03/23/2022   Lab Results  Component Value Date   HGBA1C 5.8 03/23/2022   Vit D def: He is not on vit D supplementation.  Witness sleep apnea, referral for sleep study was placed but he has not received call with appt information.  Review of Systems  Constitutional:  Positive for fatigue. Negative for activity change,  appetite change and fever.  HENT:  Negative for nosebleeds, sore throat and trouble swallowing.   Eyes:  Negative for redness and visual disturbance.  Respiratory:  Negative for cough, shortness of breath and wheezing.   Cardiovascular:  Negative for chest pain, palpitations and leg swelling.  Gastrointestinal:  Negative for abdominal pain, blood in stool, nausea and vomiting.  Endocrine: Negative for cold intolerance, heat intolerance, polydipsia, polyphagia and polyuria.  Genitourinary:  Negative for decreased urine volume, dysuria, genital sores, hematuria and testicular pain.  Musculoskeletal:  Negative for back pain and myalgias.  Skin:  Negative for color change and rash.  Neurological:  Negative for syncope, weakness and headaches.  Hematological:  Negative for adenopathy. Does not bruise/bleed easily.  Psychiatric/Behavioral:  Negative for confusion and sleep disturbance.   All other systems reviewed and are negative.  Current Outpatient Medications on File Prior to Visit  Medication Sig Dispense Refill   Omega-3 Fatty Acids (FISH OIL) 1000 MG CAPS Take 1 capsule (1,000 mg total) by mouth in the morning and at bedtime.  0   vitamin B-12 (CYANOCOBALAMIN) 1000 MCG tablet Take 1 tablet (1,000 mcg total) by mouth daily.     No current facility-administered medications on file prior to visit.    Past Medical History:  Diagnosis Date   Hyperlipidemia    History reviewed. No pertinent surgical history.  No Known Allergies  Family History  Problem Relation Age of Onset   Diabetes Mother    Colon cancer Father  Colon cancer Brother    Social History   Socioeconomic History   Marital status: Married    Spouse name: Not on file   Number of children: Not on file   Years of education: Not on file   Highest education level: Never attended school  Occupational History   Not on file  Tobacco Use   Smoking status: Never   Smokeless tobacco: Never  Substance and Sexual  Activity   Alcohol use: Yes    Alcohol/week: 2.0 standard drinks of alcohol    Types: 2 Glasses of wine per week   Drug use: Never   Sexual activity: Yes    Birth control/protection: None  Other Topics Concern   Not on file  Social History Narrative   Not on file   Social Determinants of Health   Financial Resource Strain: Patient Declined (03/23/2022)   Overall Financial Resource Strain (CARDIA)    Difficulty of Paying Living Expenses: Patient declined  Food Insecurity: No Food Insecurity (03/23/2022)   Hunger Vital Sign    Worried About Running Out of Food in the Last Year: Never true    Ran Out of Food in the Last Year: Never true  Transportation Needs: No Transportation Needs (03/23/2022)   PRAPARE - Administrator, Civil Service (Medical): No    Lack of Transportation (Non-Medical): No  Physical Activity: Unknown (03/23/2022)   Exercise Vital Sign    Days of Exercise per Week: 0 days    Minutes of Exercise per Session: Not on file  Stress: Patient Declined (03/23/2022)   [employer] of Occupational Health - Occupational Stress Questionnaire    Feeling of Stress : Patient declined  Social Connections: Moderately Isolated (03/23/2022)   Social Connection and Isolation Panel [NHANES]    Frequency of Communication with Friends and Family: Once a week    Frequency of Social Gatherings with Friends and Family: Once a week    Attends Religious Services: 1 to 4 times per year    Active Member of [employer] or Organizations: No    Attends Banker Meetings: Not on file    Marital Status: Married    Vitals:   05/25/23 0754  BP: 120/78  Pulse: 90  Temp: 98.3 F (36.8 C)  SpO2: 98%   Body mass index is 34.92 kg/m.  Wt Readings from Last 3 Encounters:  05/25/23 197 lb 2 oz (89.4 kg)  11/13/22 209 lb 6.4 oz (95 kg)  10/30/22 209 lb 4 oz (94.9 kg)    Physical Exam Vitals and nursing note reviewed.  Constitutional:      General: He is not in acute  distress.    Appearance: He is well-developed.  HENT:     Head: Normocephalic and atraumatic.     Right Ear: Tympanic membrane, ear canal and external ear normal.     Left Ear: Tympanic membrane, ear canal and external ear normal.  Eyes:     Extraocular Movements: Extraocular movements intact.     Conjunctiva/sclera: Conjunctivae normal.     Pupils: Pupils are equal, round, and reactive to light.  Neck:     Thyroid: No thyromegaly.     Trachea: No tracheal deviation.  Cardiovascular:     Rate and Rhythm: Normal rate and regular rhythm.     Pulses:          Dorsalis pedis pulses are 2+ on the right side and 2+ on the left side.     Heart sounds: No murmur  heard.    Comments: Varicose veins LE's, bilateral. Pulmonary:     Effort: Pulmonary effort is normal. No respiratory distress.     Breath sounds: Normal breath sounds.  Abdominal:     Palpations: Abdomen is soft. There is no hepatomegaly or mass.     Tenderness: There is no abdominal tenderness.  Genitourinary:    Comments: No concerns. Musculoskeletal:        General: No tenderness.     Cervical back: Normal range of motion.     Right lower leg: No edema.     Left lower leg: No edema.     Comments: No major deformities appreciated and no signs of synovitis.  Lymphadenopathy:     Cervical: No cervical adenopathy.     Upper Body:     Right upper body: No supraclavicular adenopathy.     Left upper body: No supraclavicular adenopathy.  Skin:    General: Skin is warm.     Findings: No erythema.  Neurological:     General: No focal deficit present.     Mental Status: He is alert and oriented to person, place, and time.     Cranial Nerves: No cranial nerve deficit.     Sensory: No sensory deficit.     Gait: Gait normal.     Deep Tendon Reflexes:     Reflex Scores:      Bicep reflexes are 2+ on the right side and 2+ on the left side.      Patellar reflexes are 2+ on the right side and 2+ on the left side. Psychiatric:         Mood and Affect: Mood and affect normal.   ASSESSMENT AND PLAN:  [name] was seen today for annual exam.  Diagnoses and all orders for this visit:  Lab Results  Component Value Date   CHOL 201 (H) 05/25/2023   HDL 32.30 (L) 05/25/2023   LDLDIRECT 134.0 05/25/2023   TRIG 225.0 (H) 05/25/2023   CHOLHDL 6 05/25/2023   Lab Results  Component Value Date   HGBA1C 5.4 05/25/2023   Lab Results  Component Value Date   CREATININE 0.89 05/25/2023   BUN 21 05/25/2023   NA 140 05/25/2023   K 4.1 05/25/2023   CL 104 05/25/2023   CO2 25 05/25/2023   Lab Results  Component Value Date   VITAMINB12 >1500 (H) 05/25/2023   Lab Results  Component Value Date   ALT 32 05/25/2023   AST 24 05/25/2023   ALKPHOS 92 05/25/2023   BILITOT 1.0 05/25/2023   Lab Results  Component Value Date   PSA 0.66 05/25/2023   PSA 0.49 03/23/2022   The 10-year ASCVD risk score (Arnett DK, et al., 2019) is: 8%   Values used to calculate the score:     Age: 56 years     Sex: Male     Is Non-Hispanic African American: No     Diabetic: No     Tobacco smoker: No     Systolic Blood Pressure: 120 mmHg     Is BP treated: No     HDL Cholesterol: 32.3 mg/dL     Total Cholesterol: 201 mg/dL  Routine general medical examination at a health care facility Assessment & Plan: We discussed the importance of regular physical activity and healthy diet for prevention of chronic illness and/or complications. Preventive guidelines reviewed. Vaccination up to date. Because immigration process status MMR given today. Varicella and Hep B titers ordered. Next CPE  in a year.   Hypertriglyceridemia Assessment & Plan: Non pharmacologic treatment recommended for now. Further recommendations will be given according to 10 years CVD risk score and lipid panel numbers.  Orders: -     Lipid panel; Future -     Comprehensive metabolic panel; Future -     LDL cholesterol, direct  Vitamin B12 deficiency Assessment &  Plan: Continue current dose of B12 supplementation.  Orders: -     Vitamin B12; Future  Vitamin D deficiency, unspecified Assessment & Plan: He is not on vit D supplementation. Further recommendations will be given according to 25 [place] vit D result.  Orders: -     VITAMIN D 25 Hydroxy (Vit-D Deficiency, Fractures); Future  Colon cancer screening -     Ambulatory referral to Gastroenterology  Prostate cancer screening -     PSA; Future  Varicella vaccination status unknown -     Varicella zoster antibody, IgG; Future  Hepatitis B vaccination status unknown -     Hepatitis B surface antigen; Future -     Hepatitis B surface antibody,qualitative; Future  Prediabetes Assessment & Plan: Consistency with a healthy life style encouraged for diabetes prevention.  Orders: -     Hemoglobin A1c; Future  Sleep apnea, unspecified type Assessment & Plan: He has not received information about appt for sleep study, another referral placed.  Orders: -     Ambulatory referral to Sleep Studies  Need for MMR vaccine -     MMR vaccine subcutaneous   Return in 1 year (on 05/24/2024) for CPE.  Basel Defalco G. [place], MD  [facility]. [place] office.

## 2023-05-25 ENCOUNTER — Ambulatory Visit (INDEPENDENT_AMBULATORY_CARE_PROVIDER_SITE_OTHER): Payer: 59 | Admitting: Family Medicine

## 2023-05-25 ENCOUNTER — Encounter: Payer: Self-pay | Admitting: Family Medicine

## 2023-05-25 VITALS — BP 120/78 | HR 90 | Temp 98.3°F | Ht 63.0 in | Wt 197.1 lb

## 2023-05-25 DIAGNOSIS — E781 Pure hyperglyceridemia: Secondary | ICD-10-CM | POA: Diagnosis not present

## 2023-05-25 DIAGNOSIS — Z23 Encounter for immunization: Secondary | ICD-10-CM

## 2023-05-25 DIAGNOSIS — Z0000 Encounter for general adult medical examination without abnormal findings: Secondary | ICD-10-CM | POA: Diagnosis not present

## 2023-05-25 DIAGNOSIS — E538 Deficiency of other specified B group vitamins: Secondary | ICD-10-CM | POA: Diagnosis not present

## 2023-05-25 DIAGNOSIS — E559 Vitamin D deficiency, unspecified: Secondary | ICD-10-CM | POA: Diagnosis not present

## 2023-05-25 DIAGNOSIS — Z1211 Encounter for screening for malignant neoplasm of colon: Secondary | ICD-10-CM

## 2023-05-25 DIAGNOSIS — Z125 Encounter for screening for malignant neoplasm of prostate: Secondary | ICD-10-CM

## 2023-05-25 DIAGNOSIS — R7303 Prediabetes: Secondary | ICD-10-CM | POA: Diagnosis not present

## 2023-05-25 DIAGNOSIS — G4730 Sleep apnea, unspecified: Secondary | ICD-10-CM | POA: Insufficient documentation

## 2023-05-25 DIAGNOSIS — Z789 Other specified health status: Secondary | ICD-10-CM

## 2023-05-25 LAB — LIPID PANEL
Cholesterol: 201 mg/dL — ABNORMAL HIGH (ref 0–200)
HDL: 32.3 mg/dL — ABNORMAL LOW (ref >39.00)
NonHDL: 168.38
Total CHOL/HDL Ratio: 6
Triglycerides: 225 mg/dL — ABNORMAL HIGH (ref 0.0–149.0)
VLDL: 45 mg/dL — ABNORMAL HIGH (ref 0.0–40.0)

## 2023-05-25 LAB — COMPREHENSIVE METABOLIC PANEL
ALT: 32 U/L (ref 0–53)
AST: 24 U/L (ref 0–37)
Albumin: 4.3 g/dL (ref 3.5–5.2)
Alkaline Phosphatase: 92 U/L (ref 39–117)
BUN: 21 mg/dL (ref 6–23)
CO2: 25 mEq/L (ref 19–32)
Calcium: 9.5 mg/dL (ref 8.4–10.5)
Chloride: 104 mEq/L (ref 96–112)
Creatinine, Ser: 0.89 mg/dL (ref 0.40–1.50)
GFR: 96.07 mL/min (ref >60.00)
Glucose, Bld: 100 mg/dL — ABNORMAL HIGH (ref 70–99)
Potassium: 4.1 mEq/L (ref 3.5–5.1)
Sodium: 140 mEq/L (ref 135–145)
Total Bilirubin: 1 mg/dL (ref 0.2–1.2)
Total Protein: 7.2 g/dL (ref 6.0–8.3)

## 2023-05-25 LAB — HEMOGLOBIN A1C: Hgb A1c MFr Bld: 5.4 % (ref 4.6–6.5)

## 2023-05-25 LAB — PSA: PSA: 0.66 ng/mL (ref 0.10–4.00)

## 2023-05-25 LAB — LDL CHOLESTEROL, DIRECT: Direct LDL: 134 mg/dL

## 2023-05-25 LAB — VITAMIN D 25 HYDROXY (VIT D DEFICIENCY, FRACTURES): VITD: 19.66 ng/mL — ABNORMAL LOW (ref 30.00–100.00)

## 2023-05-25 LAB — VITAMIN B12: Vitamin B-12: >1500 pg/mL — ABNORMAL HIGH (ref 211–911)

## 2023-05-25 NOTE — Patient Instructions (Addendum)
A few things to remember from today's visit:  Routine general medical examination at a health care facility  Hypertriglyceridemia - Plan: Lipid panel, Comprehensive metabolic panel  Vitamin B12 deficiency - Plan: Vitamin B12  Vitamin D deficiency, unspecified - Plan: VITAMIN D 25 Hydroxy (Vit-D Deficiency, Fractures)  Colon cancer screening - Plan: Ambulatory referral to Gastroenterology  Prostate cancer screening - Plan: PSA  Varicella vaccination status unknown - Plan: Varicella Zoster Antibody, IgG  Hepatitis B vaccination status unknown - Plan: Hepatitis B surface antigen, Hepatitis B surface antibody,qualitative  Prediabetes - Plan: Hemoglobin A1c  Sleep apnea, unspecified type - Plan: Ambulatory referral to Sleep Studies  Llame a su seguro y pregunte a quien puede ver para la vasectomia.  If you need refills for medications you take chronically, please call your pharmacy. Do not use My Chart to request refills or for acute issues that need immediate attention. If you send a my chart message, it may take a few days to be addressed, specially if I am not in the office.  Please be sure medication list is accurate. If a new problem present, please set up appointment sooner than planned today.  Mantenimiento de Research officer, political party, Male Adoptar un estilo de vida saludable y recibir atencin preventiva son importantes para promover la salud y Counsellor. Consulte al mdico sobre: El esquema adecuado para hacerse pruebas y exmenes peridicos. Cosas que puede hacer por su cuenta para prevenir enfermedades y [place] sano. Qu debo saber sobre la dieta, el peso y el ejercicio? Consuma una dieta saludable  Consuma una dieta que incluya muchas verduras, frutas, productos lcteos con bajo contenido de [place] y Associate Professor. No consuma muchos alimentos ricos en grasas slidas, azcares agregados o sodio. Mantenga un peso saludable El ndice de masa  muscular [facility]) es una medida que puede utilizarse para identificar posibles problemas de [place]. Proporciona una estimacin de la grasa corporal basndose en el peso y la altura. Su mdico puede ayudarle a Engineer, site IMC y a Personnel officer o Pharmacologist un peso saludable. Haga ejercicio con regularidad Haga ejercicio con regularidad. Esta es una de las prcticas ms importantes que puede hacer por su salud. La [employer] de los adultos deben seguir estas pautas: Education officer, environmental, al menos, 150 minutos de actividad fsica por semana. El ejercicio debe aumentar la frecuencia cardaca y Media planner transpirar (ejercicio de intensidad moderada). Hacer ejercicios de fortalecimiento por lo [employer] por semana. Agregue esto a su plan de ejercicio de intensidad moderada. Pase menos tiempo sentado. Incluso la actividad fsica ligera puede ser beneficiosa. Controle sus niveles de colesterol y lpidos en la sangre Comience a realizarse anlisis de lpidos y Oncologist en la sangre a los 20 aos y luego reptalos cada 5 aos. Es posible que Insurance underwriter los niveles de colesterol con mayor frecuencia si: Sus niveles de lpidos y colesterol son altos. Es mayor de 40 aos. Presenta un alto riesgo de padecer enfermedades cardacas. Qu debo saber sobre las pruebas de deteccin del cncer? Muchos tipos de cncer pueden detectarse de manera temprana y, a menudo, pueden prevenirse. Segn su historia clnica y sus antecedentes familiares, es posible que deba realizarse pruebas de deteccin del cncer en diferentes edades. Esto puede incluir pruebas de deteccin de lo siguiente: Building services engineer. Cncer de prstata. Cncer de piel. Cncer de pulmn. Qu debo saber sobre la enfermedad cardaca, la diabetes y la hipertensin arterial? Presin arterial y enfermedad cardaca La hipertensin arterial causa enfermedades cardacas y [place]  riesgo de accidente cerebrovascular. Es ms probable que esto se manifieste en las  personas que tienen lecturas de presin arterial alta o tienen sobrepeso. Hable con el mdico sobre sus valores de presin arterial deseados. Hgase controlar la presin arterial: Cada 3 a 5 aos si tiene entre 18 y 30 aos. Todos los aos si es mayor de 40 aos. Si tiene entre 65 y 71 aos y es fumador o Insurance underwriter, pregntele al mdico si debe realizarse una prueba de deteccin de aneurisma artico abdominal (AAA) por nica vez. Diabetes Realcese exmenes de deteccin de la diabetes con regularidad. Este anlisis revisa el nivel de azcar en la sangre en [place]. Hgase las pruebas de deteccin: Cada tres aos despus de los 45 aos de edad si tiene un peso normal y un bajo riesgo de padecer diabetes. Con ms frecuencia y a partir de [place] edad inferior si tiene sobrepeso o un alto riesgo de padecer diabetes. Qu debo saber sobre la prevencin de infecciones? Hepatitis B Si tiene un riesgo ms alto de contraer hepatitis B, debe someterse a un examen de deteccin de este virus. Hable con el mdico para averiguar si tiene riesgo de contraer la infeccin por hepatitis B. Hepatitis C Se recomienda un anlisis de [place] para: Todos los que nacieron entre 1945 y [phone]. Todas las personas que tengan un riesgo de haber contrado hepatitis C. Enfermedades de transmisin sexual (ETS) Debe realizarse pruebas de deteccin de ITS todos los aos, incluidas la gonorrea y la clamidia, si: Es sexualmente activo y es menor de [address]. Es mayor de [address], y Public affairs consultant informa que corre riesgo de tener este tipo de infecciones. La actividad sexual ha cambiado desde que le hicieron la ltima prueba de deteccin y tiene un riesgo mayor de Warehouse manager clamidia o Copy. Pregntele al mdico si usted tiene riesgo. Pregntele al mdico si usted tiene un alto riesgo de Primary school teacher VIH. El mdico tambin puede recomendarle un medicamento recetado para ayudar a evitar la infeccin por el VIH. Si elige tomar medicamentos para  prevenir el VIH, primero debe [employer] de deteccin del VIH. Luego debe hacerse anlisis cada 3 meses mientras est tomando los medicamentos. Siga estas indicaciones en su casa: Consumo de alcohol No beba alcohol si el mdico se lo prohbe. Si bebe alcohol: Limite la cantidad que consume de 0 a 2 bebidas por da. Sepa cunta cantidad de alcohol hay en las bebidas que toma. En los [address], una medida equivale a una botella de cerveza de 12 oz (355 ml), un vaso de vino de 5 oz (148 ml) o un vaso de una bebida alcohlica de alta graduacin de 1 oz (44 ml). Estilo de vida No consuma ningn producto que contenga nicotina o tabaco. Estos productos incluyen cigarrillos, tabaco para Theatre manager y aparatos de vapeo, como los Administrator, Civil Service. Si necesita ayuda para dejar de consumir estos productos, consulte al mdico. No consuma drogas. No comparta agujas. Solicite ayuda a su mdico si necesita apoyo o informacin para abandonar las drogas. Indicaciones generales Realcese los estudios de rutina de [address], dentales y de Wellsite geologist. Mantngase al da con las vacunas. Infrmele a su mdico si: Se siente deprimido con frecuencia. Alguna vez ha sido vctima de [place] o no se siente seguro en su casa. Resumen Adoptar un estilo de vida saludable y recibir atencin preventiva son importantes para promover la salud y Counsellor. Siga las instrucciones del mdico acerca de una dieta saludable, el ejercicio y  la realizacin de pruebas o exmenes para detectar enfermedades. Siga las instrucciones del mdico con respecto al control del colesterol y la presin arterial. Esta informacin no tiene Theme park manager el consejo del mdico. Asegrese de hacerle al mdico cualquier pregunta que tenga. Document Revised: 04/23/2021 Document Reviewed: 04/23/2021 Elsevier Patient Education  2024 [employer].

## 2023-05-26 LAB — HEPATITIS B SURFACE ANTIBODY,QUALITATIVE: Hep B S Ab: NONREACTIVE

## 2023-05-26 LAB — HEPATITIS B SURFACE ANTIGEN: Hepatitis B Surface Ag: NONREACTIVE

## 2023-05-26 LAB — VARICELLA ZOSTER ANTIBODY, IGG: Varicella IgG: 3237 index

## 2023-05-27 NOTE — Assessment & Plan Note (Signed)
Continue current dose of B12 supplementation.

## 2023-05-27 NOTE — Assessment & Plan Note (Signed)
Consistency with a healthy life style encouraged for diabetes prevention. 

## 2023-05-27 NOTE — Assessment & Plan Note (Signed)
We discussed the importance of regular physical activity and healthy diet for prevention of chronic illness and/or complications. Preventive guidelines reviewed. Vaccination up to date. Because immigration process status MMR given today. Varicella and Hep B titers ordered. Next CPE in a year.

## 2023-05-27 NOTE — Assessment & Plan Note (Signed)
He is not on vit D supplementation. Further recommendations will be given according to 25 [place] vit D result. 

## 2023-05-27 NOTE — Assessment & Plan Note (Addendum)
He has not received information about appt for sleep study, another referral placed.

## 2023-05-27 NOTE — Assessment & Plan Note (Signed)
Non pharmacologic treatment recommended for now. Further recommendations will be given according to 10 years CVD risk score and lipid panel numbers. 

## 2024-06-13 ENCOUNTER — Encounter: Payer: Self-pay | Admitting: Family Medicine

## 2024-06-13 ENCOUNTER — Ambulatory Visit: Payer: Self-pay | Admitting: Family Medicine

## 2024-06-13 ENCOUNTER — Ambulatory Visit (INDEPENDENT_AMBULATORY_CARE_PROVIDER_SITE_OTHER): Payer: Self-pay | Admitting: Family Medicine

## 2024-06-13 VITALS — BP 126/80 | HR 80 | Resp 16 | Ht 63.0 in | Wt 207.0 lb

## 2024-06-13 DIAGNOSIS — E538 Deficiency of other specified B group vitamins: Secondary | ICD-10-CM | POA: Diagnosis not present

## 2024-06-13 DIAGNOSIS — Z1211 Encounter for screening for malignant neoplasm of colon: Secondary | ICD-10-CM

## 2024-06-13 DIAGNOSIS — E559 Vitamin D deficiency, unspecified: Secondary | ICD-10-CM

## 2024-06-13 DIAGNOSIS — E781 Pure hyperglyceridemia: Secondary | ICD-10-CM

## 2024-06-13 DIAGNOSIS — Z23 Encounter for immunization: Secondary | ICD-10-CM

## 2024-06-13 DIAGNOSIS — Z131 Encounter for screening for diabetes mellitus: Secondary | ICD-10-CM | POA: Diagnosis not present

## 2024-06-13 DIAGNOSIS — Z0000 Encounter for general adult medical examination without abnormal findings: Secondary | ICD-10-CM | POA: Diagnosis not present

## 2024-06-13 DIAGNOSIS — Z125 Encounter for screening for malignant neoplasm of prostate: Secondary | ICD-10-CM

## 2024-06-13 DIAGNOSIS — G4730 Sleep apnea, unspecified: Secondary | ICD-10-CM

## 2024-06-13 LAB — HEMOGLOBIN A1C: Hgb A1c MFr Bld: 5.7 % (ref 4.6–6.5)

## 2024-06-13 LAB — COMPREHENSIVE METABOLIC PANEL WITH GFR
ALT: 24 U/L (ref 0–53)
AST: 20 U/L (ref 0–37)
Albumin: 4.4 g/dL (ref 3.5–5.2)
Alkaline Phosphatase: 87 U/L (ref 39–117)
BUN: 18 mg/dL (ref 6–23)
CO2: 27 meq/L (ref 19–32)
Calcium: 9.1 mg/dL (ref 8.4–10.5)
Chloride: 103 meq/L (ref 96–112)
Creatinine, Ser: 0.83 mg/dL (ref 0.40–1.50)
GFR: 97.4 mL/min (ref >60.00)
Glucose, Bld: 91 mg/dL (ref 70–99)
Potassium: 4.2 meq/L (ref 3.5–5.1)
Sodium: 139 meq/L (ref 135–145)
Total Bilirubin: 0.8 mg/dL (ref 0.2–1.2)
Total Protein: 6.9 g/dL (ref 6.0–8.3)

## 2024-06-13 LAB — LIPID PANEL
Cholesterol: 190 mg/dL (ref 0–200)
HDL: 33.3 mg/dL — ABNORMAL LOW (ref >39.00)
LDL Cholesterol: 101 mg/dL — ABNORMAL HIGH (ref 0–99)
NonHDL: 156.91
Total CHOL/HDL Ratio: 6
Triglycerides: 279 mg/dL — ABNORMAL HIGH (ref 0.0–149.0)
VLDL: 55.8 mg/dL — ABNORMAL HIGH (ref 0.0–40.0)

## 2024-06-13 LAB — VITAMIN B12: Vitamin B-12: >1500 pg/mL — ABNORMAL HIGH (ref 211–911)

## 2024-06-13 LAB — VITAMIN D 25 HYDROXY (VIT D DEFICIENCY, FRACTURES): VITD: 20.72 ng/mL — ABNORMAL LOW (ref 30.00–100.00)

## 2024-06-13 LAB — PSA: PSA: 0.77 ng/mL (ref 0.10–4.00)

## 2024-06-13 NOTE — Assessment & Plan Note (Signed)
 He is not interested in statins or treatment. We discussed adverse effects of OSA if not adequately treated. Weight loss encouraged.

## 2024-06-13 NOTE — Assessment & Plan Note (Signed)
 Continue same dose of B12 supplementation. Further recommendation will be given according to B12 result.

## 2024-06-13 NOTE — Progress Notes (Signed)
 Discussed the use of AI scribe software for clinical note transcription with the patient, who gave verbal consent to proceed.  History of Present Illness Anthony Nichols is a 58 year old male with past medical history significant for hyperlipidemia, prediabetes, B12 deficiency, vitamin D  deficiency, and sleep apnea who presents for an annual physical exam.  He has not been engaging in regular exercise and primarily consumes home-cooked meals.  He is mindful of portion sizes but has difficulty limiting bread intake. He does not consume sweets frequently. He mentions that he has a family history of weight issues, with all siblings similarly affected.  He sleeps approximately eight hours per night.  He does not smoke and consumes alcohol moderately, with about three beers on weekends and occasionally wine.  He has not undergone regular eye exams or dental check-ups.  He has not had any screening for colon cancer and is unsure about his vaccination history, particularly for hepatitis and polio.   Health Maintenance  Topic Date Due   Colon Cancer Screening  Never done   COVID-19 Vaccine (1 - 2024-25 season) Never done   Flu Shot  06/30/2024   Hepatitis B Vaccine (2 of 2 - CpG 2-dose series) 07/11/2024   DTaP/Tdap/Td vaccine (2 - Td or Tdap) 04/23/2031   Hepatitis C Screening  Completed   HIV Screening  Completed   Zoster (Shingles) Vaccine  Completed   Pneumococcal Vaccination  Aged Out   HPV Vaccine  Aged Out   Meningitis B Vaccine  Aged Out   Immunization History  Administered Date(s) Administered   Hepatitis A, Adult 06/13/2024   Hepb-cpg 06/13/2024   IPV 06/13/2024   MMR 05/25/2023   PNEUMOCOCCAL CONJUGATE-20 06/13/2024   Tdap 04/22/2021   Zoster Recombinant(Shingrix ) 04/22/2021, 03/23/2022   B12 deficiency: He is currently taking vitamin B12 two to three times a week.  Lab Results  Component Value Date   VITAMINB12 >1500 (H) 05/25/2023   Vitamin D  deficiency: He  does not take vitamin D , although it has been noted to be low in the past. Lab Results  Component Value Date   VD25OH 19.66 (L) 05/25/2023   Hyperlipidemia: On nonpharmacologic treatment. Lab Results  Component Value Date   CHOL 201 (H) 05/25/2023   HDL 32.30 (L) 05/25/2023   LDLDIRECT 134.0 05/25/2023   TRIG 225.0 (H) 05/25/2023   CHOLHDL 6 05/25/2023   Lab Results  Component Value Date   HGBA1C 5.4 05/25/2023   Sleep apnea: He has not undergone a sleep study.   Prostate cancer screening: No changes in urinary frequency or nocturia.  Lab Results  Component Value Date   PSA 0.66 05/25/2023   PSA 0.49 03/23/2022   Review of Systems  Constitutional:  Positive for fatigue. Negative for activity change, appetite change and fever.  HENT:  Negative for nosebleeds, sore throat and trouble swallowing.   Eyes:  Negative for redness and visual disturbance.  Respiratory:  Negative for cough, shortness of breath and wheezing.   Cardiovascular:  Negative for chest pain, palpitations and leg swelling.  Gastrointestinal:  Negative for abdominal pain, blood in stool, nausea and vomiting.  Endocrine: Negative for cold intolerance, heat intolerance, polydipsia, polyphagia and polyuria.  Genitourinary:  Negative for decreased urine volume, dysuria, genital sores, hematuria and testicular pain.  Musculoskeletal:  Positive for arthralgias. Negative for myalgias.  Skin:  Negative for color change and rash.  Neurological:  Negative for dizziness, seizures, syncope, weakness, numbness and headaches.  Hematological:  Negative for  adenopathy. Does not bruise/bleed easily.  Psychiatric/Behavioral:  Negative for confusion. The patient is not nervous/anxious.    Current Outpatient Medications on File Prior to Visit  Medication Sig Dispense Refill   Omega-3 Fatty Acids (FISH OIL ) 1000 MG CAPS Take 1 capsule (1,000 mg total) by mouth in the morning and at bedtime.  0   vitamin B-12 (CYANOCOBALAMIN ) 1000  MCG tablet Take 1 tablet (1,000 mcg total) by mouth daily.     No current facility-administered medications on file prior to visit.   Past Medical History:  Diagnosis Date   Arthritis    Hyperlipidemia    History reviewed. No pertinent surgical history.  No Known Allergies  Family History  Problem Relation Age of Onset   Diabetes Mother    Colon cancer Father    Colon cancer Brother    Social History   Socioeconomic History   Marital status: Married    Spouse name: Not on file   Number of children: Not on file   Years of education: Not on file   Highest education level: Never attended school  Occupational History   Not on file  Tobacco Use   Smoking status: Never   Smokeless tobacco: Never  Substance and Sexual Activity   Alcohol use: Yes    Alcohol/week: 2.0 standard drinks of alcohol    Types: 2 Glasses of wine per week   Drug use: Never   Sexual activity: Yes    Birth control/protection: None  Other Topics Concern   Not on file  Social History Narrative   Not on file   Social Drivers of Health   Financial Resource Strain: Patient Declined (03/23/2022)   Overall Financial Resource Strain (CARDIA)    Difficulty of Paying Living Expenses: Patient declined  Food Insecurity: No Food Insecurity (03/23/2022)   Hunger Vital Sign    Worried About Running Out of Food in the Last Year: Never true    Ran Out of Food in the Last Year: Never true  Transportation Needs: No Transportation Needs (03/23/2022)   PRAPARE - Administrator, Civil Service (Medical): No    Lack of Transportation (Non-Medical): No  Physical Activity: Unknown (03/23/2022)   Exercise Vital Sign    Days of Exercise per Week: 0 days    Minutes of Exercise per Session: Not on file  Stress: Patient Declined (03/23/2022)   [employer] of Occupational Health - Occupational Stress Questionnaire    Feeling of Stress : Patient declined  Social Connections: Moderately Isolated (03/23/2022)    Social Connection and Isolation Panel    Frequency of Communication with Friends and Family: Once a week    Frequency of Social Gatherings with Friends and Family: Once a week    Attends Religious Services: 1 to 4 times per year    Active Member of [employer] or Organizations: No    Attends Banker Meetings: Not on file    Marital Status: Married   Vitals:   06/13/24 0800  BP: 126/80  Pulse: 80  Resp: 16  SpO2: 98%   Body mass index is 36.67 kg/m.  Wt Readings from Last 3 Encounters:  06/13/24 207 lb (93.9 kg)  05/25/23 197 lb 2 oz (89.4 kg)  11/13/22 209 lb 6.4 oz (95 kg)   Physical Exam Vitals and nursing note reviewed.  Constitutional:      General: He is not in acute distress.    Appearance: He is well-developed.  HENT:     Head:  Normocephalic and atraumatic.     Right Ear: Tympanic membrane, ear canal and external ear normal.     Left Ear: Tympanic membrane, ear canal and external ear normal.     Mouth/Throat:     Dentition: Has dentures.  Eyes:     Extraocular Movements: Extraocular movements intact.     Conjunctiva/sclera: Conjunctivae normal.     Pupils: Pupils are equal, round, and reactive to light.  Neck:     Thyroid: No thyroid mass or thyromegaly.  Cardiovascular:     Rate and Rhythm: Normal rate and regular rhythm.     Pulses:          Dorsalis pedis pulses are 2+ on the right side and 2+ on the left side.     Heart sounds: No murmur heard. Pulmonary:     Effort: Pulmonary effort is normal. No respiratory distress.     Breath sounds: Normal breath sounds.  Abdominal:     Palpations: Abdomen is soft. There is no hepatomegaly or mass.     Tenderness: There is no abdominal tenderness.  Genitourinary:    Comments: No concerns. Musculoskeletal:        General: No tenderness.     Cervical back: Normal range of motion.     Comments: No major deformities appreciated and no signs of synovitis.  Lymphadenopathy:     Cervical: No cervical  adenopathy.     Upper Body:     Right upper body: No supraclavicular adenopathy.     Left upper body: No supraclavicular adenopathy.  Skin:    General: Skin is warm.     Findings: No erythema.  Neurological:     General: No focal deficit present.     Mental Status: He is alert and oriented to person, place, and time.     Cranial Nerves: No cranial nerve deficit.     Sensory: No sensory deficit.     Gait: Gait normal.     Deep Tendon Reflexes:     Reflex Scores:      Bicep reflexes are 2+ on the right side and 2+ on the left side.      Patellar reflexes are 2+ on the right side and 2+ on the left side. Psychiatric:        Mood and Affect: Mood and affect normal.   ASSESSMENT AND PLAN:  Anthony Nichols, Anthony Nichols was seen today for annual exam.  Diagnoses and all orders for this visit: Orders Placed This Encounter  Procedures   Pneumococcal conjugate vaccine 20-valent (Prevnar 20)   Hepatitis A vaccine adult IM   Heplisav-B  (HepB-CPG) Vaccine   Poliovirus vaccine IPV subcutaneous/IM   Comprehensive metabolic panel with GFR   Lipid panel   Vitamin B12   VITAMIN D  25 Hydroxy (Vit-D Deficiency, Fractures)   Hemoglobin A1c   PSA   Ambulatory referral to Gastroenterology   Lab Results  Component Value Date   NA 139 06/13/2024   CL 103 06/13/2024   K 4.2 06/13/2024   CO2 27 06/13/2024   BUN 18 06/13/2024   CREATININE 0.83 06/13/2024   GFR 97.40 06/13/2024   CALCIUM 9.1 06/13/2024   ALBUMIN 4.4 06/13/2024   GLUCOSE 91 06/13/2024   Lab Results  Component Value Date   ALT 24 06/13/2024   AST 20 06/13/2024   ALKPHOS 87 06/13/2024   BILITOT 0.8 06/13/2024   Lab Results  Component Value Date   CHOL 190 06/13/2024   HDL 33.30 (L) 06/13/2024   LDLCALC 101 (  H) 06/13/2024   LDLDIRECT 134.0 05/25/2023   TRIG 279.0 (H) 06/13/2024   CHOLHDL 6 06/13/2024   Lab Results  Component Value Date   VITAMINB12 >1500 (H) 06/13/2024   Lab Results  Component Value Date   VD25OH 20.72  (L) 06/13/2024   Lab Results  Component Value Date   HGBA1C 5.7 06/13/2024   Lab Results  Component Value Date   PSA 0.77 06/13/2024   PSA 0.66 05/25/2023   PSA 0.49 03/23/2022   Routine general medical examination at a health care facility Assessment & Plan: We discussed the importance of regular physical activity and healthy diet for prevention of chronic illness and/or complications. Preventive guidelines reviewed. Vaccination updated today, he needs some extra immunization for immigration purposes.  Today he received polio, Prevnar 20, hepatitis B, and hepatitis A vaccine. GI referral placed to discuss colonoscopy. Next CPE in a year.   Vitamin D  deficiency, unspecified Assessment & Plan: Currently he is not on vitamin D  supplementation. Further recommendation will be given according to 25 [place] vitamin D  result.  Orders: -     VITAMIN D  25 Hydroxy (Vit-D Deficiency, Fractures); Future  Vitamin B12 deficiency Assessment & Plan: Continue same dose of B12 supplementation. Further recommendation will be given according to B12 result.  Orders: -     Vitamin B12; Future  Hypertriglyceridemia Assessment & Plan: Currently he is not on pharmacologic treatment. Low-fat diet recommended. Further recommendation will be given according to lipid panel result.  Orders: -     Comprehensive metabolic panel with GFR; Future -     Lipid panel; Future  Prostate cancer screening -     PSA; Future  Colon cancer screening -     Ambulatory referral to Gastroenterology  Immunization due -     Pneumococcal conjugate vaccine 20-valent -     Hepatitis A vaccine adult IM -     Heplisav-B  (HepB-CPG) Vaccine -     Poliovirus vaccine IPV subcutaneous/IM  Sleep apnea, unspecified type Assessment & Plan: He is not interested in statins or treatment. We discussed adverse effects of OSA if not adequately treated. Weight loss encouraged.   Diabetes mellitus screening -     Hemoglobin  A1c; Future   Return in 1 year (on 06/13/2025) for CPE, chronic problems, Labs.  Anthony Seda G. [place], MD  [facility]. [place] office.

## 2024-06-13 NOTE — Assessment & Plan Note (Signed)
 Currently he is not on pharmacologic treatment. Low-fat diet recommended. Further recommendation will be given according to lipid panel result.

## 2024-06-13 NOTE — Assessment & Plan Note (Signed)
Currently he is not on vitamin D supplementation. °Further recommendation will be given according to 25 [place] vitamin D result. °

## 2024-06-13 NOTE — Patient Instructions (Addendum)
 A few things to remember from today's visit:  Routine general medical examination at a health care facility  Vitamin D  deficiency, unspecified - Plan: VITAMIN D  25 Hydroxy (Vit-D Deficiency, Fractures)  Vitamin B12 deficiency - Plan: Vitamin B12  Hypertriglyceridemia - Plan: Comprehensive metabolic panel with GFR, Lipid panel  Prediabetes - Plan: Hemoglobin A1c  Prostate cancer screening - Plan: PSA  Colon cancer screening - Plan: Ambulatory referral to Gastroenterology  No coma pan por 4 semanas y notara perdida de [place]. Camine 15 min diario.  If you need refills for medications you take chronically, please call your pharmacy. Do not use My Chart to request refills or for acute issues that need immediate attention. If you send a my chart message, it may take a few days to be addressed, specially if I am not in the office.  Please be sure medication list is accurate. If a new problem present, please set up appointment sooner than planned today.  Mantenimiento de Research officer, political party, Male Adoptar un estilo de vida saludable y recibir atencin preventiva son importantes para promover la salud y Counsellor. Consulte al mdico sobre: El esquema adecuado para hacerse pruebas y exmenes peridicos. Cosas que puede hacer por su cuenta para prevenir enfermedades y [place] sano. Qu debo saber sobre la dieta, el peso y el ejercicio? Consuma una dieta saludable  Consuma una dieta que incluya muchas verduras, frutas, productos lcteos con bajo contenido de grasa y protenas magras. No consuma muchos alimentos ricos en grasas slidas, azcares agregados o sodio. Mantenga un peso saludable El ndice de masa muscular [facility]) es una medida que puede utilizarse para identificar posibles problemas de [place]. Proporciona una estimacin de la grasa corporal basndose en el peso y la altura. Su mdico puede ayudarle a determinar su IMC y a Personnel officer o Pharmacologist un peso  saludable. Haga ejercicio con regularidad Haga ejercicio con regularidad. Esta es una de las prcticas ms importantes que puede hacer por su salud. La [employer] de los adultos deben seguir estas pautas: Education officer, environmental, al menos, 150 minutos de actividad fsica por semana. El ejercicio debe aumentar la frecuencia cardaca y Media planner transpirar (ejercicio de intensidad moderada). Hacer ejercicios de fortalecimiento por lo [employer] por semana. Agregue esto a su plan de ejercicio de intensidad moderada. Pase menos tiempo sentado. Incluso la actividad fsica ligera puede ser beneficiosa. Controle sus niveles de colesterol y lpidos en la sangre Comience a realizarse anlisis de lpidos y Oncologist en la sangre a los 20 aos y luego reptalos cada 5 aos. Es posible que Insurance underwriter los niveles de colesterol con mayor frecuencia si: Sus niveles de lpidos y colesterol son altos. Es mayor de 40 aos. Presenta un alto riesgo de padecer enfermedades cardacas. Qu debo saber sobre las pruebas de deteccin del cncer? Muchos tipos de cncer pueden detectarse de manera temprana y, a menudo, pueden prevenirse. Segn su historia clnica y sus antecedentes familiares, es posible que deba realizarse pruebas de deteccin del cncer en diferentes edades. Esto puede incluir pruebas de deteccin de lo siguiente: Building services engineer. Cncer de prstata. Cncer de piel. Cncer de pulmn. Qu debo saber sobre la enfermedad cardaca, la diabetes y la hipertensin arterial? Presin arterial y enfermedad cardaca La hipertensin arterial causa enfermedades cardacas y lesotho el riesgo de accidente cerebrovascular. Es ms probable que esto se manifieste en las personas que tienen lecturas de presin arterial alta o tienen sobrepeso. Hable con el mdico sobre sus valores de presin arterial deseados.  Hgase controlar la presin arterial: Cada 3 a 5 aos si tiene entre 18 y 87 aos. Todos los aos si es mayor de  40 aos. Si tiene entre 65 y 93 aos y es fumador o Insurance underwriter, pregntele al mdico si debe realizarse una prueba de deteccin de aneurisma artico abdominal (AAA) por nica vez. Diabetes Realcese exmenes de deteccin de la diabetes con regularidad. Este anlisis revisa el nivel de azcar en la sangre en [place]. Hgase las pruebas de deteccin: Cada tres aos despus de los 45 aos de edad si tiene un peso normal y un bajo riesgo de padecer diabetes. Con ms frecuencia y a partir de [place] edad inferior si tiene sobrepeso o un alto riesgo de padecer diabetes. Qu debo saber sobre la prevencin de infecciones? Hepatitis B Si tiene un riesgo ms alto de contraer hepatitis B, debe someterse a un examen de deteccin de este virus. Hable con el mdico para averiguar si tiene riesgo de contraer la infeccin por hepatitis B. Hepatitis C Se recomienda un anlisis de [place] para: Todos los que nacieron entre 1944 y [phone]. Todas las personas que tengan un riesgo de haber contrado hepatitis C. Enfermedades de transmisin sexual (ETS) Debe realizarse pruebas de deteccin de ITS todos los aos, incluidas la gonorrea y la clamidia, si: Es sexualmente activo y es menor de [address]. Es mayor de [address], y Public affairs consultant informa que corre riesgo de tener este tipo de infecciones. La actividad sexual ha cambiado desde que le hicieron la ltima prueba de deteccin y tiene un riesgo mayor de tener clamidia o Copy. Pregntele al mdico si usted tiene riesgo. Pregntele al mdico si usted tiene un alto riesgo de Primary school teacher VIH. El mdico tambin puede recomendarle un medicamento recetado para ayudar a evitar la infeccin por el VIH. Si elige tomar medicamentos para prevenir el VIH, primero debe [employer] de deteccin del VIH. Luego debe hacerse anlisis cada 3 meses mientras est tomando los medicamentos. Siga estas indicaciones en su casa: Consumo de alcohol No beba alcohol si el mdico se lo  prohbe. Si bebe alcohol: Limite la cantidad que consume de 0 a 2 bebidas por da. Sepa cunta cantidad de alcohol hay en las bebidas que toma. En los [address], una medida equivale a una botella de cerveza de 12 oz (355 ml), un vaso de vino de 5 oz (148 ml) o un vaso de una bebida alcohlica de alta graduacin de 1 oz (44 ml). Estilo de vida No consuma ningn producto que contenga nicotina o tabaco. Estos productos incluyen cigarrillos, tabaco para Theatre manager y aparatos de vapeo, como los cigarrillos electrnicos. Si necesita ayuda para dejar de consumir estos productos, consulte al mdico. No consuma drogas. No comparta agujas. Solicite ayuda a su mdico si necesita apoyo o informacin para abandonar las drogas. Indicaciones generales Realcese los estudios de rutina de [address], dentales y de Wellsite geologist. Mantngase al da con las vacunas. Infrmele a su mdico si: Se siente deprimido con frecuencia. Alguna vez ha sido vctima de maltrato o no se siente seguro en su casa. Resumen Adoptar un estilo de vida saludable y recibir atencin preventiva son importantes para promover la salud y Counsellor. Siga las instrucciones del mdico acerca de una dieta saludable, el ejercicio y la realizacin de pruebas o exmenes para Hotel manager. Siga las instrucciones del mdico con respecto al control del colesterol y la presin arterial. Esta informacin no tiene Theme park manager el consejo del mdico.  Asegrese de hacerle al mdico cualquier pregunta que tenga. Document Revised: 04/23/2021 Document Reviewed: 04/23/2021 Elsevier Patient Education  2024 [employer].

## 2024-06-13 NOTE — Assessment & Plan Note (Signed)
 We discussed the importance of regular physical activity and healthy diet for prevention of chronic illness and/or complications. Preventive guidelines reviewed. Vaccination updated today, he needs some extra immunization for immigration purposes.  Today he received polio, Prevnar 20, hepatitis B, and hepatitis A vaccine. GI referral placed to discuss colonoscopy. Next CPE in a year.

## 2024-11-06 ENCOUNTER — Telehealth: Payer: Self-pay

## 2024-11-06 NOTE — Telephone Encounter (Unsigned)
 Copied from CRM [id number]. Topic: Clinical - Medication Question >> Nov 06, 2024  1:32 PM Dedra B wrote: Reason for CRM: Pt's wife, Nanda, called to inquire about the polio vaccine for the pt. He needs the vaccine as aprt of the immigration process. Informed her that it was not on the list of vaccines given. She said that she previously received the vaccine at the clinic. Pls let Tulia know if the vaccine can be given at clinic.  Call interpreted by: Curlee ID# [id number]

## 2024-11-06 NOTE — Telephone Encounter (Signed)
 Error

## 2024-11-07 NOTE — Telephone Encounter (Signed)
 It is okay to give polio vaccine as requested.  I do not think keep it in the clinic, it might need to be ordered. Thanks, BJ

## 2024-11-08 NOTE — Telephone Encounter (Signed)
 Called interpreter and we spoke with the patients wife she was informed that we no longer stock/order the polio vaccine. Informed the patient to go to the health department or his local pharmacy to receive this vaccination. His wife stated she will take him to there local pharmacy.
